# Patient Record
Sex: Male | Born: 1985 | Hispanic: Yes | Marital: Married | Smoking: Former smoker
Health system: Southern US, Community
[De-identification: ages and names within clinical notes are randomized; demographics above are authoritative.]

---

## 2014-08-04 ENCOUNTER — Ambulatory Visit (INDEPENDENT_AMBULATORY_CARE_PROVIDER_SITE_OTHER): Payer: Medicaid Other | Admitting: Family Medicine

## 2014-08-04 VITALS — BP 136/82 | HR 73 | Temp 98.2°F | Ht 72.5 in | Wt 213.0 lb

## 2014-08-04 DIAGNOSIS — N50819 Testicular pain, unspecified: Secondary | ICD-10-CM

## 2014-08-04 DIAGNOSIS — N508 Other specified disorders of male genital organs: Secondary | ICD-10-CM | POA: Diagnosis not present

## 2014-08-04 DIAGNOSIS — Z008 Encounter for other general examination: Secondary | ICD-10-CM

## 2014-08-04 DIAGNOSIS — Z0289 Encounter for other administrative examinations: Secondary | ICD-10-CM

## 2014-08-04 DIAGNOSIS — M25511 Pain in right shoulder: Secondary | ICD-10-CM | POA: Diagnosis not present

## 2014-08-04 NOTE — Progress Notes (Signed)
Jeremiah Mcconnell interpreter utilized during today's visit.  [facility]  HPI:  Patient presents to [facility] today for a new patient appointment to establish general primary care, also to discuss testicular pain and Right shoulder pain.  Patient arrived as refugee from [place].  In [place] 15 months in urban environment, not refugee camp.   Wife and 27 year old with him.  Wife's father was disappeared. Fled due to threats against his family from [employer] fighters.   RIght shoulder pain.  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  Pain with heavy lifting front of shoulder.    Testicular pain:  Present for several years.  Told he had varicocele in [place], recommended for surgery but left for [place] before this could be done.  Describes as dull ache in midline of testes.  Has had negative STD screens in past.  Has not tried anything for relief.  4-5/10 in severity.  No fevers or chills.  No discharge or dysuria.     ROS: See HPI   Preventative Care History: -Seen at health department?: yes -need records.   Past Medical Hx:  - denies  Past Surgical Hx:  -denies  Family Hx: updated in Epic - Number of family members:  Wife and daughter - Grandmother with diabetes.    PHYSICAL EXAM: Gen:  Alert, cooperative patient who appears stated age in no acute distress.  Vital signs reviewed. HEENT:  [place]/AT.  EOMI, PERRL.  MMM, tonsils non-erythematous, non-edematous.  External ears WNL, Bilateral TM's normal without retraction, redness or bulging.  Cardiac:  Regular rate and rhythm without murmur auscultated.  Good S1/S2. Pulm:  Clear to auscultation bilaterally with good air movement.  No wheezes or rales noted.   Abd:  Soft/nondistended/nontender.  Good bowel sounds throughout all four quadrants.  No masses noted.  MSK: Left shoulder WNL. Right shoulder with normal  appearance, no bruising or effusion.  tenderness along AC joint and lateral aspect of acromion.  Pain with external rotation.  Neer's positive.  No biceps tendon tenderness.  Difficulty with lateral range of motion past 90 degrees, either active or passive.     Male genitalia: not done no penile lesions or discharge no testicular masses Penis: normal, no lesions and uncircumcised Testicles: normal, no masses Scrotum: normal and I do not note a varicocele.  Does have some tenderness midline of scrotum/testes.  No lesions noted. No hernia noted.

## 2014-08-04 NOTE — Patient Instructions (Signed)
We will set you up for an ultrasound for your testicles.  We will call you with that appt.    Take the Ibuprofen for your shoulder.  The Sports Medicine office will call you about your Right shoulder.

## 2014-08-06 DIAGNOSIS — N50819 Testicular pain, unspecified: Secondary | ICD-10-CM | POA: Insufficient documentation

## 2014-08-06 DIAGNOSIS — Z0289 Encounter for other administrative examinations: Secondary | ICD-10-CM | POA: Insufficient documentation

## 2014-08-06 DIAGNOSIS — M25511 Pain in right shoulder: Secondary | ICD-10-CM | POA: Insufficient documentation

## 2014-08-06 NOTE — Assessment & Plan Note (Signed)
I do not actually feel a varicocele on my exam. Sending for ultrasound for better visualization of testicles.   Do not note a hernia.

## 2014-08-06 NOTE — Assessment & Plan Note (Signed)
Other than the above 2 problems, he is doing well.   FU in 6 months or sooner if problems arise.  Await records from HD.

## 2014-08-06 NOTE — Assessment & Plan Note (Signed)
Present for past several months.  Sounds like he continues to reinjure during work.   He desires referral to Sports Medicine. Plan to do this today for ultrasound.  Sounds like AC joint but exam not entirely consistent with this alone

## 2014-12-27 ENCOUNTER — Emergency Department (HOSPITAL_COMMUNITY)
Admission: EM | Admit: 2014-12-27 | Discharge: 2014-12-27 | Disposition: A | Discharge status: Home / Self Care | Payer: Medicaid Other | Attending: Emergency Medicine | Admitting: Emergency Medicine

## 2014-12-27 ENCOUNTER — Encounter (HOSPITAL_BASED_OUTPATIENT_CLINIC_OR_DEPARTMENT_OTHER): Payer: Self-pay | Admitting: Emergency Medicine

## 2014-12-27 ENCOUNTER — Emergency Department (HOSPITAL_BASED_OUTPATIENT_CLINIC_OR_DEPARTMENT_OTHER): Payer: Worker's Compensation

## 2014-12-27 ENCOUNTER — Emergency Department (HOSPITAL_BASED_OUTPATIENT_CLINIC_OR_DEPARTMENT_OTHER)
Admission: EM | Admit: 2014-12-27 | Discharge: 2014-12-27 | Disposition: A | Discharge status: Home / Self Care | Payer: Worker's Compensation | Attending: Emergency Medicine | Admitting: Emergency Medicine

## 2014-12-27 ENCOUNTER — Encounter (HOSPITAL_COMMUNITY): Payer: Self-pay | Admitting: Emergency Medicine

## 2014-12-27 DIAGNOSIS — Z87891 Personal history of nicotine dependence: Secondary | ICD-10-CM | POA: Diagnosis not present

## 2014-12-27 DIAGNOSIS — W2209XA Striking against other stationary object, initial encounter: Secondary | ICD-10-CM | POA: Diagnosis not present

## 2014-12-27 DIAGNOSIS — S63297A Dislocation of distal interphalangeal joint of left little finger, initial encounter: Secondary | ICD-10-CM | POA: Insufficient documentation

## 2014-12-27 DIAGNOSIS — Y9389 Activity, other specified: Secondary | ICD-10-CM | POA: Diagnosis not present

## 2014-12-27 DIAGNOSIS — W231XXA Caught, crushed, jammed, or pinched between stationary objects, initial encounter: Secondary | ICD-10-CM | POA: Insufficient documentation

## 2014-12-27 DIAGNOSIS — S61217A Laceration without foreign body of left little finger without damage to nail, initial encounter: Secondary | ICD-10-CM | POA: Insufficient documentation

## 2014-12-27 DIAGNOSIS — Y990 Civilian activity done for income or pay: Secondary | ICD-10-CM | POA: Diagnosis not present

## 2014-12-27 DIAGNOSIS — Y9289 Other specified places as the place of occurrence of the external cause: Secondary | ICD-10-CM | POA: Insufficient documentation

## 2014-12-27 DIAGNOSIS — S61218A Laceration without foreign body of other finger without damage to nail, initial encounter: Secondary | ICD-10-CM

## 2014-12-27 DIAGNOSIS — S63257A Unspecified dislocation of left little finger, initial encounter: Secondary | ICD-10-CM

## 2014-12-27 DIAGNOSIS — S6992XA Unspecified injury of left wrist, hand and finger(s), initial encounter: Secondary | ICD-10-CM | POA: Diagnosis present

## 2014-12-27 MED ORDER — LIDOCAINE-EPINEPHRINE (PF) 2 %-1:200000 IJ SOLN: 20.0000 mL | Freq: Once | INTRAMUSCULAR | Status: DC

## 2014-12-27 MED ORDER — LIDOCAINE HCL (PF) 1 % IJ SOLN
30.0000 mL | Freq: Once | INTRAMUSCULAR | Status: AC
  Administered 2014-12-27: 30 mL via INTRADERMAL
  Filled 2014-12-27: qty 30

## 2014-12-27 MED ORDER — LIDOCAINE HCL (PF) 1 % IJ SOLN
30.0000 mL | Freq: Once | INTRAMUSCULAR | Status: DC
  Filled 2014-12-27: qty 30

## 2014-12-27 NOTE — ED Provider Notes (Signed)
S: Jeremiah Mcconnell [name] is a 29 y.o. male presents to the ED with left little finger laceration as a transfer from [facility] to be evaluated by Dr. Amanda Pea.  Pt. Smashed his finger with an iron table while at work.  Patient with laceration and dislocation of the left little finger. O:  General: Awake  HEENT: Atraumatic  Resp: Normal effort  Cardiac: RRR Abd: Nondistended, soft  Neuro:No focal weakness  MSK: Laceration to the palmar side of the left little finger over the PIP extending distally A/P:  Pt with dislocation of the left little finger with associated laceration. Patient reevaluated by Dr. Amanda Pea in the ED for reduction and repair.    5:33 PM Repair completed by Dr. Amanda Pea.  Pt is to follow-up in the office as directed.    BP 130/77 mmHg  Pulse 58  Temp(Src) 98.4 F (36.9 C) (Oral)  Resp 18  SpO2 98%   1. Dislocation of left little finger, initial encounter   2. Laceration of left little finger w/o foreign body w/o damage to nail, initial encounter       Dierdre Forth, PA-C 12/27/14 1739  Laurence Spates, MD 12/28/14 [phone]

## 2014-12-27 NOTE — ED Notes (Signed)
Pt states caught left 5th digit between two tables, presents with large, irregular laceration to area, bleeding controlled, area currently soaking in betadine/ sterile water solution

## 2014-12-27 NOTE — ED Notes (Signed)
Good capillary refill noted, good sensation noted on assessment

## 2014-12-27 NOTE — ED Notes (Signed)
Translation line utilized

## 2014-12-27 NOTE — Discharge Instructions (Signed)
Cuidado de un desgarro en los adultos (Laceration Care, Adult) Un desgarro es un corte que atraviesa todas las capas de la piel y llega al tejido que se encuentra debajo de la piel. Algunos desgarros cicatrizan por s solos. Otros se deben cerrar con puntos (suturas), grapas, tiras adhesivas o adhesivo para la piel. El cuidado adecuado de un desgarro reduce al mnimo el riesgo de infecciones y ayuda a una mejor cicatrizacin. CMO CUIDAR DEL DESGARRO Si se utilizaron suturas o grapas:  Mantenga la herida limpia y seca.  Si le colocaron una venda (vendaje), debe cambiarla al menos una vez al da o como se lo haya indicado el mdico. Tambin debe cambiarla si se moja o se ensucia.  Mantenga la herida completamente seca durante las primeras 24horas o como se lo haya indicado el mdico. Transcurrido ese tiempo, puede ducharse o tomar baos de inmersin. No obstante, asegrese de no sumergir la herida en agua hasta que le hayan quitado las suturas o las grapas.  Limpie la herida una vez al da o como se lo haya indicado el mdico:  Lave la herida con agua y jabn.  Enjuguela con agua para quitar todo el jabn.  Seque dando palmaditas con una toalla limpia. No frote la herida.  Despus de limpiar la herida, aplique una delgada capa de ungento con antibitico como se lo haya indicado el mdico. Esto ayudar a prevenir las infecciones y a evitar que el vendaje se adhiera a la herida.  Las suturas o las grapas deben retirarse como lo haya indicado el mdico. Si se utilizaron tiras adhesivas:  Mantenga la herida limpia y seca.  Si le colocaron una venda (vendaje), debe cambiarla al menos una vez al da o como se lo haya indicado el mdico. Tambin debe cambiarla si se moja o se ensucia.  No deje que las tiras adhesivas se mojen. Puede baarse o ducharse, pero tenga cuidado de no mojar la herida.  Si se moja, squela dando palmaditas con una toalla limpia. No frote la herida.  Las tiras  adhesivas se caen solas. Puede recortar las tiras a medida que la herida cicatriza. No quite las tiras adhesivas que an estn pegadas a la herida. Ellas se caern cuando sea el momento. Si se utiliz pegamento para la piel:  Trate de mantener la herida seca; sin embargo, puede mojarla ligeramente cuando se bae o se duche. No sumerja la herida en el agua, por ejemplo, al nadar.  Despus de ducharse o baarse, seque la herida con cuidado dando palmaditas con una toalla limpia. No frote la herida.  No practique actividades que lo hagan transpirar mucho hasta que el adhesivo se haya salido solo.  No aplique lquidos, cremas ni ungentos medicinales en la herida mientras est el adhesivo. De lo contrario, puede despegar la pelcula de adhesivo antes de que la herida cicatrice.  Si le colocaron una venda (vendaje), debe cambiarla al menos una vez al da o como se lo haya indicado el mdico. Tambin debe cambiarla si se moja o se ensucia.  Si la herida est cubierta con un vendaje, tenga cuidado de no aplicar cinta adhesiva directamente sobre el adhesivo. De lo contrario, puede hacer que el adhesivo se despegue antes de que la herida haya cicatrizado.  No toque el adhesivo. Normalmente, el adhesivo permanece sobre la piel de 5 a 10das y luego se sale solo. Instrucciones generales  Tome los medicamentos de venta libre y los recetados solamente como se lo haya indicado el mdico.    Si le recetaron un ungento o un medicamento con antibitico, aplquelo o tmelo como se lo haya indicado el mdico. No deje de usarlo aunque la afeccin mejore.  Para ayudar a evitar la formacin de cicatrices, cbrase la herida con pantalla solar siempre que est al aire libre despus de que le hayan retirado los puntos o las tiras adhesivas o cuando todava tenga el adhesivo en la piel y la herida haya cicatrizado. Use una pantalla solar con factor de proteccin solar (FPS) de por lo menos30.  No se rasque ni se  toque la herida.  Concurra a todas las visitas de control como se lo haya indicado el mdico. Esto es importante.  Controle la herida todos los das para detectar signos de infeccin. Est atento a lo siguiente:  Dolor, hinchazn o enrojecimiento.  Lquido, sangre o pus.  Cuando est sentado o acostado, eleve la zona de la lesin por encima del nivel del corazn, si es posible. SOLICITE ATENCIN MDICA SI:  Le aplicaron la antitetnica y tiene hinchazn, dolor intenso, enrojecimiento o hemorragia en el sitio de la inyeccin.  Tiene fiebre.  La herida estaba cerrada y se abre.  Percibe que sale mal olor de la herida o del vendaje.  Nota un cuerpo extrao en la herida, como un trozo de madera o vidrio.  El dolor no se alivia con los medicamentos.  Tiene ms enrojecimiento, hinchazn o dolor en el lugar de la herida.  Observa lquido, sangre o pus que salen de la herida.  Observa que la piel cerca de la herida cambia de color.  Debe cambiar el vendaje con frecuencia debido a que hay secrecin de lquido, sangre o pus de la herida.  Aparece una nueva erupcin cutnea.  Tiene entumecimiento alrededor de la herida. SOLICITE ATENCIN MDICA DE INMEDIATO SI:  Tiene mucha hinchazn alrededor de la herida.  El dolor aumenta repentinamente y es intenso.  Tiene bultos dolorosos cerca de la herida o en la piel en cualquier parte del cuerpo.  Tiene una lnea roja que sale de la herida.  La herida est en la mano o en el pie y no puede mover correctamente uno de los dedos.  La herida est en la mano o en el pie y observa que los dedos tienen un tono plido o azulado.   Esta informacin no tiene como fin reemplazar el consejo del mdico. Asegrese de hacerle al mdico cualquier pregunta que tenga.   Document Released: 03/07/2005 Document Revised: 07/22/2014 Elsevier Interactive Patient Education 2016 Elsevier Inc.  

## 2014-12-27 NOTE — ED Notes (Signed)
Interpreter obtained by phone to assist with explaining DC instructions to patient and to go to next facility for further evaluation by Dr. Wendall Stade, MD, ED Charge RN at [facility] notified of pts arrival to their facility for further evaluation

## 2014-12-27 NOTE — ED Notes (Signed)
Spoke with [place]- Tressie Ellis ED Charge Nurse, hand off report provided

## 2014-12-27 NOTE — ED Notes (Signed)
Patient was at work  And hand was smashed between 2 tables and split left hand open

## 2014-12-27 NOTE — ED Notes (Signed)
Hand tray at bedside.  

## 2014-12-27 NOTE — Discharge Instructions (Signed)
1. Medications: Keflex, Oxycodone, usual home medications 2. Treatment: rest, drink plenty of fluids, care for wound as discussed with Dr. Amanda Pea 3. Follow Up: Please followup with Dr. Amanda Pea in 8-10 days.  Please call the office on Monday to set up this appointment.  Return to the ED for signs of infection or other concerns. Out of work for 6-8 weeks.

## 2014-12-27 NOTE — ED Notes (Signed)
DR Amanda Pea  At bed side to assess PT.

## 2014-12-27 NOTE — Op Note (Signed)
NAMERUFORD, DUDZINSKI   ACCOUNT NO.:  [id number]  MEDICAL RECORD NO.:  [id number]  LOCATION:  TR09C                        FACILITY:  [facility]  PHYSICIAN:  Dionne Ano. Kory Panjwani, M.D.DATE OF BIRTH:  1985-07-09  DATE OF PROCEDURE: DATE OF DISCHARGE:  12/27/2014                              OPERATIVE REPORT   PREOPERATIVE DIAGNOSIS:  Left small finger open distal interphalangeal joint dislocation with associated large laceration.  POSTOPERATIVE DIAGNOSIS:  Left small finger open distal interphalangeal joint dislocation with associated large laceration.  PROCEDURE: 1. Irrigation and debridement, open distal interphalangeal joint     injury with flexor tendon sheath injury left small finger after     crushing injury.  This was an excisional debridement of skin,     subcutaneous tissue, periosteal tissue, and peritendinous tissue. 2. Open relocation of distal interphalangeal joint dislocation. 3. Flexor tendon tenolysis and exploration. 4. Ulnar digital nerve repair, left small finger. 5. AP, lateral, and oblique x-rays performed, examined, and     interpreted by myself.  SURGEON:  Dionne Ano. Amanda Pea, M.D.  ASSISTANT:  None.  COMPLICATIONS:  None.  ANESTHESIA:  Block anesthetic.  DESCRIPTION OF PROCEDURE:  The patient was seen by myself, consented, had a Spanish interpreter consent the patient.  We discussed the pre and postop aftermath and time-out was observed.  He was given intermetacarpal/field block lidocaine without epinephrine.  Following this, he was prepped and draped in usual sterile fashion with Betadine scrub and paint x4 separate scrubs given that his hand was dirty. Following this, I performed a sterile field isolation and I and D of skin, subcutaneous tissue, and periosteal tissue, peritendinous tissue was accomplished.  This was an excisional debridement with scissor tip, knife, blade, and curette.  A 2 L were placed through and through the  wound.  Following this, I then performed an open relocation of the distal interphalangeal joint.  He looked quite well.  I then confirmed this with radiographs AP and lateral.  Following this, I then performed an ulnar digital nerve repair with loupe magnification utilizing fine nylon suture.  The patient tolerated this well.  This was epineural repair.  He had a good trifurcation to sew into.  There were no complicating features.  Following this, I performed a repair of the 2.5 cm laceration about the volar ulnar aspect of the finger.  I then demonstrated full active range of motion.  The patient looked well.  He was dressed with Adaptic, Xeroform, and had excellent refill.  Tourniquet time was less than 30 minutes.  There were no complicating features.  He was dressed in a dorsal blocking splint.  All sponge, needle, and instrument counts were reported as correct.  I did confirm reduction in the splint.     Dionne Ano. Amanda Pea, M.D.     [facility]  D:  12/27/2014  T:  12/27/2014  Job:  [id number]

## 2014-12-27 NOTE — Consult Note (Signed)
Reason for Consult:open DIP injury left small finger with dislocation Referring Physician: Joaquin Courts Corrales Jeremiah Mcconnell is an 29 y.o. male.  HPI: 46 show male status post injury apparent air bread. Less small finger has a crushing injury with DIP dislocation and open volar laceration. He denies other injury. He is pleasant male. I discussed him all issues through a Spanish interpreter on the telephone.  History reviewed. No pertinent past medical history.  History reviewed. No pertinent past surgical history.  No family history on file.  Social History:  reports that he has quit smoking. He does not have any smokeless tobacco history on file. He reports that he does not drink alcohol. His drug history is not on file.  Allergies: No Known Allergies  Medications: I have reviewed the patient's current medications.  No results found for this or any previous visit (from the past 48 hour(s)).  Dg Hand Complete Left  12/27/2014   CLINICAL DATA:  Left fifth digit laceration.  EXAM: LEFT HAND - COMPLETE 3+ VIEW  COMPARISON:  None.  FINDINGS: There is noted lateral and posterior dislocation of the fifth distal phalanx relative to middle phalanx. Laceration is seen in the soft tissues laterally. There appears to be a small bone fragment proximal to the fifth distal phalanx. No other abnormality is noted in the left hand.  IMPRESSION: Dislocation of the fifth distal phalanx is noted relative to middle phalanx, with soft tissue laceration seen laterally in this area. Probable small bone fragment is seen proximal to the distal phalanx concerning for small fracture.   Electronically Signed   By: Lupita Raider, M.D.   On: 12/27/2014 14:01    ROS Blood pressure 152/92, pulse 63, temperature 98.4 F (36.9 C), temperature source Oral, resp. rate 16, SpO2 100 %. Physical Exam Open distal interphalangeal joint dislocation left small finger with disarray the soft tissue. He has intact refill The  patient is alert and oriented in no acute distress. The patient complains of pain in the affected upper extremity.  The patient is noted to have a normal HEENT exam. Lung fields show equal chest expansion and no shortness of breath. Abdomen exam is nontender without distention. Lower extremity examination does not show any fracture dislocation or blood clot symptoms. Pelvis is stable and the neck and back are stable and nontender. Assessment/Plan: Open DIP dislocation with large laceration left small finger. Will plan ID and up instructions as necessary. We are planning surgery for your upper extremity. The risk and benefits of surgery to include risk of bleeding, infection, anesthesia,  damage to normal structures and failure of the surgery to accomplish its intended goals of relieving symptoms and restoring function have been discussed in detail. With this in mind we plan to proceed. I have specifically discussed with the patient the pre-and postoperative regime and the dos and don'ts and risk and benefits in great detail. Risk and benefits of surgery also include risk of dystrophy(CRPS), chronic nerve pain, failure of the healing process to go onto completion and other inherent risks of surgery The relavent the pathophysiology of the disease/injury process, as well as the alternatives for treatment and postoperative course of action has been discussed in great detail with the patient who desires to proceed.  We will do everything in our power to help you (the patient) restore function to the upper extremity. It is a pleasure to see this patient today.  Karen Chafe 12/27/2014, 5:57 PM

## 2014-12-27 NOTE — ED Notes (Signed)
MD at bedside. 

## 2014-12-27 NOTE — ED Notes (Signed)
Spanish interpreter [phone] contacted through interpreter line.

## 2014-12-27 NOTE — ED Notes (Signed)
Pt reports smashed left pinkie finger at work with the iron table. Pt can move finger. Pt states they gave me "some pills". NAD noted. Pt ambulatory and A/O x4.

## 2014-12-27 NOTE — ED Notes (Signed)
Pt does not have any form of picture ID for UDS to be performed, per pt all paper work, etc is at his job site in a locker

## 2014-12-27 NOTE — ED Provider Notes (Signed)
CSN: [id number]     Arrival date & time 12/27/14  1224 History   First MD Initiated Contact with Patient 12/27/14 1234     Chief Complaint  Patient presents with  . Hand Injury     (Consider location/radiation/quality/duration/timing/severity/associated sxs/prior Treatment) Patient is a 29 y.o. male presenting with hand injury.  Hand Injury Upper extremity pain location: L 5th finger from PIP to DIP and extending to pulp. Time since incident:  30 minutes Injury: yes   Mechanism of injury comment:  Pinched finger between large tables Pain details:    Quality:  Aching   Radiates to:  Does not radiate   Severity:  Moderate   Onset quality:  Sudden   Timing:  Constant   Progression:  Unchanged Dislocation: no   Foreign body present:  No foreign bodies Tetanus status:  Up to date Prior injury to area:  No Relieved by:  Nothing Worsened by:  Exercise, movement and stretching area Ineffective treatments:  None tried Associated symptoms: no neck pain, no numbness and no stiffness     History reviewed. No pertinent past medical history. History reviewed. No pertinent past surgical history. History reviewed. No pertinent family history. Social History  Substance Use Topics  . Smoking status: Former Games developer  . Smokeless tobacco: None  . Alcohol Use: None    Review of Systems  Musculoskeletal: Negative for stiffness and neck pain.  All other systems reviewed and are negative.     Allergies  Review of patient's allergies indicates no known allergies.  Home Medications   Prior to Admission medications   Not on File   BP 130/83 mmHg  Pulse 71  Temp(Src) 98.9 F (37.2 C) (Oral)  Resp 16  SpO2 99% Physical Exam  Constitutional: He is oriented to person, place, and time. He appears well-developed and well-nourished.  HENT:  Head: Normocephalic and atraumatic.  Eyes: Conjunctivae and EOM are normal.  Neck: Normal range of motion. Neck supple.  Cardiovascular: Normal  rate, regular rhythm and normal heart sounds.   Pulmonary/Chest: Effort normal and breath sounds normal. No respiratory distress.  Abdominal: He exhibits no distension. There is no tenderness. There is no rebound and no guarding.  Musculoskeletal: Normal range of motion.  Laceration to L palmar 5th digit from distal to PIP extending over DIP  Neurological: He is alert and oriented to person, place, and time.  Skin: Skin is warm and dry.  Vitals reviewed.   ED Course  Procedures (including critical care time) Labs Review Labs Reviewed - No data to display  Imaging Review Dg Hand Complete Left  12/27/2014   CLINICAL DATA:  Left fifth digit laceration.  EXAM: LEFT HAND - COMPLETE 3+ VIEW  COMPARISON:  None.  FINDINGS: There is noted lateral and posterior dislocation of the fifth distal phalanx relative to middle phalanx. Laceration is seen in the soft tissues laterally. There appears to be a small bone fragment proximal to the fifth distal phalanx. No other abnormality is noted in the left hand.  IMPRESSION: Dislocation of the fifth distal phalanx is noted relative to middle phalanx, with soft tissue laceration seen laterally in this area. Probable small bone fragment is seen proximal to the distal phalanx concerning for small fracture.   Electronically Signed   By: Lupita Raider, M.D.   On: 12/27/2014 14:01   I have personally reviewed and evaluated these images and lab results as part of my medical decision-making.   EKG Interpretation None  MDM   Final diagnoses:  Laceration of little finger, initial encounter    29 y.o. male without pertinent PMH presents with laceration from crush/pinch injury.  Sensation intact, tetanus UTD.  Wu as above.  Spoke with hand Amanda Pea) who will [facility] at [facility]  DC for transfer by POV  I have reviewed all laboratory and imaging studies if ordered as above  1. Laceration of little finger, initial encounter         Mirian Mo,  MD 12/27/14 1506

## 2014-12-27 NOTE — ED Notes (Signed)
Declined W/C at D/C and was escorted to lobby by RN. 

## 2014-12-27 NOTE — Progress Notes (Signed)
Patient ID: Jeremiah Mcconnell, male   DOB: 04/02/1985, 29 y.o.   MRN: [id number] See surgical note [id number] [employer] MD

## 2015-01-19 ENCOUNTER — Ambulatory Visit (INDEPENDENT_AMBULATORY_CARE_PROVIDER_SITE_OTHER): Payer: Medicaid Other | Admitting: *Deleted

## 2015-01-19 DIAGNOSIS — Z23 Encounter for immunization: Secondary | ICD-10-CM

## 2015-02-20 ENCOUNTER — Telehealth: Payer: Self-pay | Admitting: *Deleted

## 2015-02-20 NOTE — Telephone Encounter (Signed)
Interpreter called on patients behalf to check on the status of patients referral to sports medicine as well as shoulder X-ray. Patient was seen on 08/04/14 and spoke with resident about x-ray of shoulder and sports med referral.  Patient is upset because he has yet to receive a follow-up call or appointment. Please contact interpreter to follow up with patient

## 2015-02-23 NOTE — Addendum Note (Signed)
Addended byGwendolyn Grant: Dajane Valli, Newt LukesJEFFREY H on: 02/23/2015 03:45 PM   Modules accepted: Orders

## 2015-02-23 NOTE — Telephone Encounter (Signed)
I don't really remember.  I'll put in a sports medicine referral

## 2015-02-23 NOTE — Telephone Encounter (Signed)
Do you remember this guy? Your note says refer to sports med but I don't see that it was done.Jeremiah Mcconnell..Jeremiah Mcconnell

## 2015-03-13 ENCOUNTER — Ambulatory Visit: Payer: Medicaid Other | Admitting: Family Medicine

## 2016-03-28 ENCOUNTER — Ambulatory Visit: Payer: Self-pay | Admitting: Internal Medicine

## 2017-03-23 IMAGING — CR DG HAND COMPLETE 3+V*L*
3 series · 3 of 3 positions shown · non-contrast
Comparison: None.

CLINICAL DATA: Left fifth digit laceration.

EXAM:
LEFT HAND - COMPLETE 3+ VIEW

[x hand pa left]
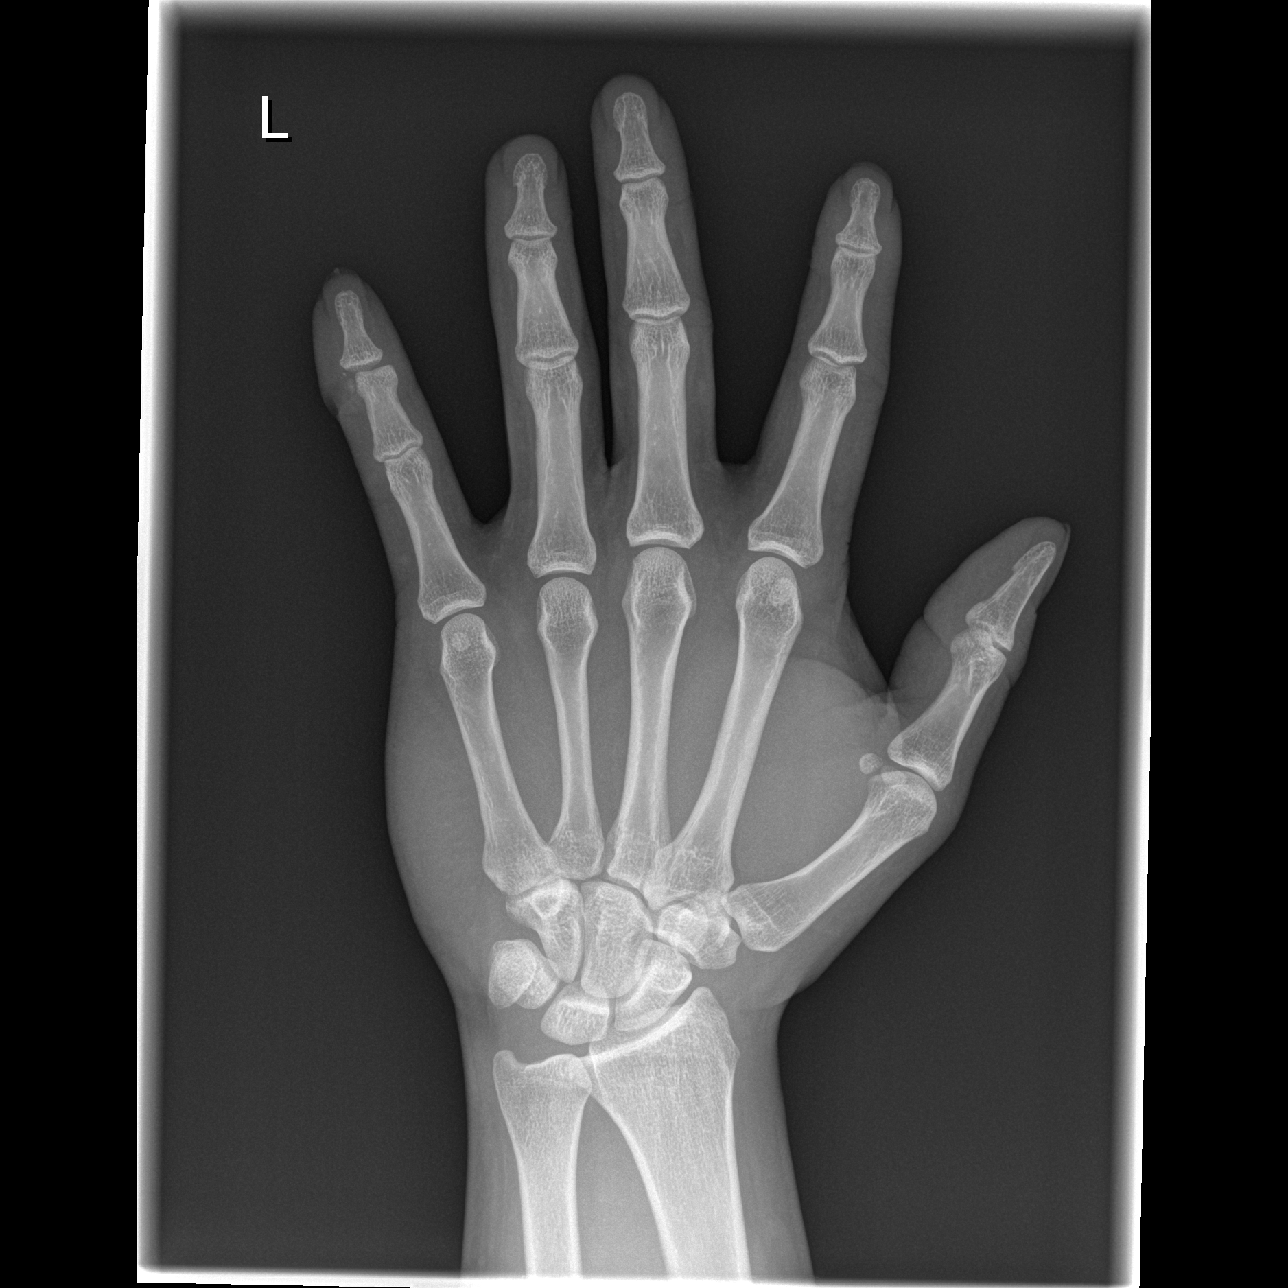

[x hand oblique left]
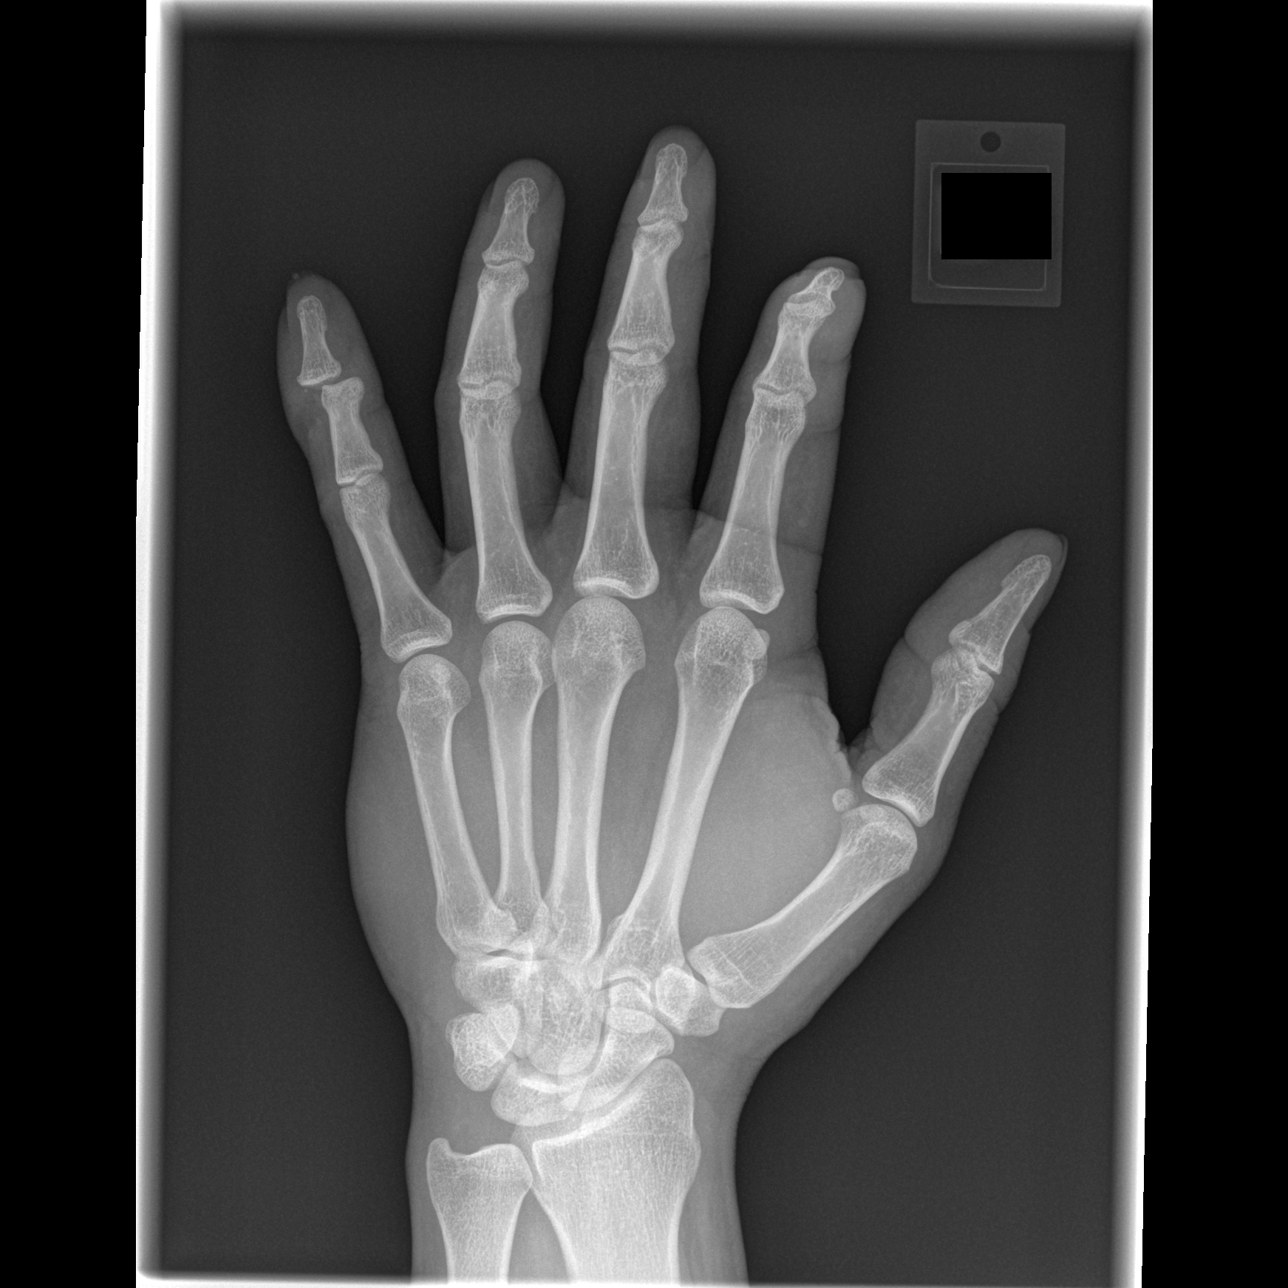

[x hand lat left *]
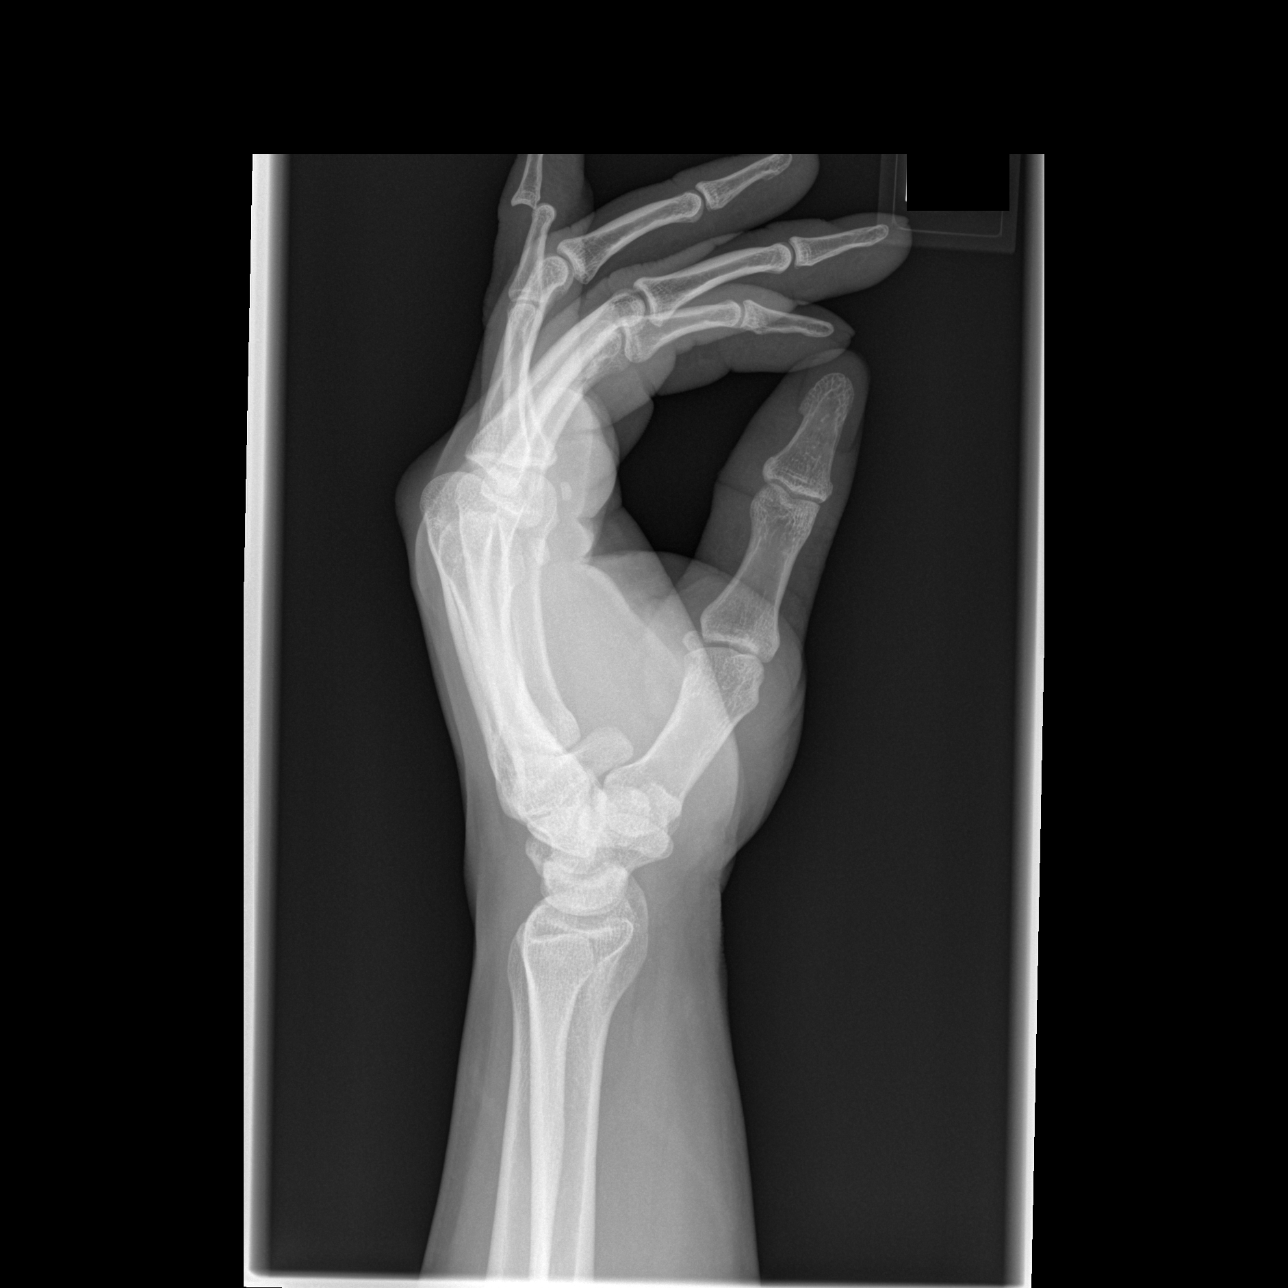

[3 of 3 positions shown; findings below may reference images not displayed]

FINDINGS: There is noted lateral and posterior dislocation of the fifth distal
phalanx relative to middle phalanx. Laceration is seen in the soft
tissues laterally. There appears to be a small bone fragment
proximal to the fifth distal phalanx. No other abnormality is noted
in the left hand.
IMPRESSION: Dislocation of the fifth distal phalanx is noted relative to middle
phalanx, with soft tissue laceration seen laterally in this area.
Probable small bone fragment is seen proximal to the distal phalanx
concerning for small fracture.

## 2019-05-14 ENCOUNTER — Ambulatory Visit: Payer: BC Managed Care – PPO | Admitting: Medical

## 2019-05-14 ENCOUNTER — Other Ambulatory Visit: Payer: Self-pay

## 2019-05-14 VITALS — BP 124/70 | HR 79 | Temp 98.0°F | Resp 18 | Ht 70.0 in | Wt 233.8 lb

## 2019-05-14 DIAGNOSIS — Z23 Encounter for immunization: Secondary | ICD-10-CM

## 2019-05-14 DIAGNOSIS — R1013 Epigastric pain: Secondary | ICD-10-CM | POA: Diagnosis not present

## 2019-05-14 DIAGNOSIS — Z113 Encounter for screening for infections with a predominantly sexual mode of transmission: Secondary | ICD-10-CM | POA: Diagnosis not present

## 2019-05-14 DIAGNOSIS — Z0000 Encounter for general adult medical examination without abnormal findings: Secondary | ICD-10-CM | POA: Diagnosis not present

## 2019-05-14 DIAGNOSIS — R5383 Other fatigue: Secondary | ICD-10-CM | POA: Diagnosis not present

## 2019-05-14 MED ORDER — FAMOTIDINE 20 MG PO TABS: 20.0000 mg | ORAL_TABLET | Freq: Every day | ORAL | 0 refills | Status: DC

## 2019-05-14 NOTE — Patient Instructions (Addendum)
For you wellness exam today I have ordered future cbc, cmp, tsh, lipid panel and hiv.(get done when fasting)  For epigastric pain, I order h pylori breath test. After test done can start famotadine daily.  Tdap today.  For fatigue add tsh, b12 and b1.  Recommend exercise and healthy diet.  We will let you know lab results as they come in.  Follow up date appointment will be determined after lab review.     Cuidados preventivos en los hombres de 21 a 39 aos de edad Preventive Care 51-65 Years Old, Male Los cuidados preventivos hacen referencia a las opciones en cuanto al estilo de vida y a las visitas al mdico, las cuales pueden promover la salud y Counsellor. Esto puede comprender lo siguiente: Un examen fsico anual. Esto tambin se conoce como control de bienestar anual. Exmenes dentales y oculares de [place] regular. Vacunas. Estudios para Hospital doctor. Opciones saludables de estilo de vida, como seguir una dieta saludable, hacer ejercicio regularmente, no usar drogas ni productos que contengan nicotina y tabaco, y limitar el consumo de alcohol. Qu puedo esperar para mi visita de cuidado preventivo? Examen fsico El mdico controlar lo siguiente: Diplomatic Services operational officer y [place]. Estos datos se pueden usar para calcular el ndice de masa corporal (IMC), una medicin que indica si usted tiene un peso saludable. Frecuencia cardaca y presin arterial. Piel para detectar manchas anormales. Asesoramiento El mdico puede hacerle preguntas sobre lo siguiente: Consumo de tabaco, alcohol y drogas. Bienestar emocional. Bienestar en el hogar y sus relaciones personales. Actividad sexual. Hbitos de alimentacin. Trabajo y Tab laboral. Ladell Heads vacunas necesito?  [place] antigripal Se recomienda aplicarse esta vacuna todos los [place]. Vacuna contra el ttanos, la difteria y la tos ferina (Tdap) Es posible que tenga que aplicarse un refuerzo contra el ttanos y la difteria (DT)  cada 10aos. Vacuna contra la varicela Es posible que tenga que aplicrsela si an no la recibi. Vacuna contra el virus del papiloma humano (VPH) Si el mdico se lo recomienda, Engineer, materials tres dosis a lo largo de 6 meses. Vacuna contra el sarampin, la rubola y las paperas (SRP) Tal vez tenga que aplicarse por lo menos una dosis de la [place]. Tambin es posible que necesite una segunda dosis. Vacuna antimeningoccica conjugada (MenACWY) Se recomienda la aplicacin de una dosis si tiene [employer] 19 y los 21aos de [place], y es estudiante universitario de Engineer, maintenance ao que vive en una residencia estudiantil, o si tiene una de varias afecciones mdicas. Podra tambin necesitar dosis de refuerzo. Vacuna antineumoccica conjugada (PCV13) Puede necesitar esta vacuna si tiene determinadas enfermedades y no se vacun anteriormente. Madilyn Fireman antineumoccica de polisacridos (PPSV23) Quizs tenga que aplicarse una o dos dosis si fuma o si tiene determinadas afecciones. Vacuna contra la hepatitis A Es posible que necesite esta vacuna si tiene ciertas afecciones o si viaja o trabaja en lugares en los que podra estar expuesto a la hepatitis A. Vacuna contra la hepatitis B Es posible que necesite esta vacuna si tiene ciertas afecciones o si viaja o trabaja en lugares en los que podra estar expuesto a la hepatitis B. Vacuna antihaemophilus influenzae tipo B (Hib) Es posible que necesite esta vacuna si tiene algunos factores de [place]. Puede recibir las vacunas en forma de dosis individuales o en forma de dos o ms vacunas juntas en la misma inyeccin (vacunas combinadas). Hable con su mdico [employer] y beneficios de las vacunas [place]. Qu pruebas necesito? Anlisis de [employer] de lpidos  y colesterol. Estos se pueden verificar cada 5 aos, a partir de los 90 aos de [place]. Anlisis de hepatitisC. Anlisis de hepatitisB. Pruebas de deteccin  Pruebas de deteccin de la diabetes.  Esto se Set designer un control del azcar en la sangre (glucosa) despus de no haber comido durante un periodo de tiempo (ayuno). Anlisis de enfermedades de transmisin sexual (ETS). Hable con su mdico [employer], las opciones de tratamiento y, si corresponde, la necesidad de Optometrist ms pruebas. Siga estas instrucciones en su casa: Comida y bebida  Siga una dieta que incluya frutas y verduras frescas, cereales integrales, protenas magras y productos lcteos descremados. Tome los suplementos vitamnicos y [employer] se lo haya indicado el mdico. No beba alcohol si el mdico se lo prohbe. Si bebe alcohol: Limite la cantidad que consume de 0 a 2 medidas por da. Est atento a la cantidad de alcohol que hay en las bebidas que toma. En los [place], una medida equivale a una botella de cerveza de 12oz (330ml), un vaso de vino de 5oz (162ml) o un vaso de una bebida alcohlica de alta graduacin de 1oz (18ml). Estilo de [employer] y las encas a diario. Mantngase activo. Haga al menos 53minutos de ejercicio 5o ms [employer]. No consuma ningn producto que contenga nicotina o tabaco, como cigarrillos, cigarrillos electrnicos y tabaco de Higher education careers adviser. Si necesita ayuda para dejar de fumar, consulte al mdico. Si es sexualmente activo, practique sexo seguro. Use un condn u otra forma de proteccin para prevenir las ITS (infecciones de transmisin sexual). Cundo volver? Acuda al mdico una vez al ao para una visita de control. Pregntele al mdico con qu frecuencia debe realizarse un control de la vista y los dientes. Mantenga su esquema de vacunacin al da. Esta informacin no tiene Marine scientist el consejo del mdico. Asegrese de hacerle al mdico cualquier pregunta que tenga. Document Revised: 03/30/2018 Document Reviewed: 03/30/2018 Elsevier Patient Education  [place].

## 2019-05-14 NOTE — Progress Notes (Signed)
Subjective:    Patient ID: Jeremiah Mcconnell, male    DOB: 1985/09/12, 34 y.o.   MRN: [id number]  HPI  Pt in for first time. Will get wellness exam/cpe today. Labs in future.  Pt works at [employer]. Pt does enjoy exercise but works a lot 5-7 days a week. Non smoker. Pt drinks about 2 times a month on weekends. Pt states he tried to eat healthy.   Pt will get tdap today.   Review of Systems  Constitutional: Positive for fatigue. Negative for chills and fever.       Mild fatigue. Less than before. He was diagnosed with covid and energy not back to normal yet.  HENT: Negative for congestion, ear discharge, ear pain, facial swelling and mouth sores.   Respiratory: Negative for choking, chest tightness, shortness of breath and wheezing.   Cardiovascular: Negative for chest pain and palpitations.  Gastrointestinal: Negative for abdominal pain.  Genitourinary: Negative for difficulty urinating, flank pain, hematuria and urgency.  Musculoskeletal: Positive for back pain.       Pt has some bilateral lower back pain since December. He states since had covid 03-25-2019. Also some fatigue since covid.  Neurological: Negative for dizziness, speech difficulty, weakness and numbness.  Hematological: Negative for adenopathy. Does not bruise/bleed easily.  Psychiatric/Behavioral: Negative for behavioral problems.    No past medical history on file.   Social History   Socioeconomic History  . Marital status: Married    Spouse name: Not on file  . Number of children: Not on file  . Years of education: Not on file  . Highest education level: Not on file  Occupational History  . Not on file  Tobacco Use  . Smoking status: Former Smoker  Substance and Sexual Activity  . Alcohol use: No    Alcohol/week: 0.0 standard drinks  . Drug use: Not on file  . Sexual activity: Not on file  Other Topics Concern  . Not on file  Social History Narrative  . Not on file   Social  Determinants of Health   Financial Resource Strain:   . Difficulty of Paying Living Expenses: Not on file  Food Insecurity:   . Worried About Charity fundraiser in the Last Year: Not on file  . Ran Out of Food in the Last Year: Not on file  Transportation Needs:   . Lack of Transportation (Medical): Not on file  . Lack of Transportation (Non-Medical): Not on file  Physical Activity:   . Days of Exercise per Week: Not on file  . Minutes of Exercise per Session: Not on file  Stress:   . Feeling of Stress : Not on file  Social Connections:   . Frequency of Communication with Friends and Family: Not on file  . Frequency of Social Gatherings with Friends and Family: Not on file  . Attends Religious Services: Not on file  . Active Member of Clubs or Organizations: Not on file  . Attends Archivist Meetings: Not on file  . Marital Status: Not on file  Intimate Partner Violence:   . Fear of Current or Ex-Partner: Not on file  . Emotionally Abused: Not on file  . Physically Abused: Not on file  . Sexually Abused: Not on file    No past surgical history on file.  No family history on file.  No Known Allergies  No current outpatient medications on file prior to visit.   No current facility-administered medications on file  prior to visit.    BP 124/70 (BP Location: Right Arm, Patient Position: Sitting, Cuff Size: Large)   Pulse 79   Temp 98 F (36.7 C) (Temporal)   Resp 18   Ht 5\' 10"  (1.778 m)   Wt 233 lb 12.8 oz (106.1 kg)   SpO2 96%   BMI 33.55 kg/m       Objective:   Physical Exam   General Mental Status- Alert. General Appearance- Not in acute distress.   Skin General: Color- Normal Color. Moisture- Normal Moisture.  Neck Carotid Arteries- Normal color. Moisture- Normal Moisture. No carotid bruits. No JVD.  Chest and Lung Exam Auscultation: Breath Sounds:-Normal.  Cardiovascular Auscultation:Rythm- Regular. Murmurs & Other Heart  Sounds:Auscultation of the heart reveals- No Murmurs.  Abdomen Inspection:-Inspeection Normal. Palpation/Percussion:Note:No mass. Palpation and Percussion of the abdomen reveal- Non Tender, Non Distended + BS, no rebound or guarding.   Neurologic Cranial Nerve exam:- CN III-XII intact(No nystagmus), symmetric smile. Strength:- 5/5 equal and symmetric strength both upper and lower extremities.     Assessment & Plan:  For you wellness exam today I have ordered future cbc, cmp, tsh, lipid panel and hiv.(get done when fasting)  Tdap today.  Recommend exercise and healthy diet.  We will let you know lab results as they come in.  Follow up date appointment will be determined after lab review.   , PA-C

## 2019-05-22 ENCOUNTER — Other Ambulatory Visit (INDEPENDENT_AMBULATORY_CARE_PROVIDER_SITE_OTHER): Payer: BC Managed Care – PPO

## 2019-05-22 ENCOUNTER — Other Ambulatory Visit: Payer: Self-pay

## 2019-05-22 DIAGNOSIS — R5383 Other fatigue: Secondary | ICD-10-CM | POA: Diagnosis not present

## 2019-05-22 DIAGNOSIS — Z0000 Encounter for general adult medical examination without abnormal findings: Secondary | ICD-10-CM | POA: Diagnosis not present

## 2019-05-22 DIAGNOSIS — Z113 Encounter for screening for infections with a predominantly sexual mode of transmission: Secondary | ICD-10-CM

## 2019-05-22 DIAGNOSIS — R1013 Epigastric pain: Secondary | ICD-10-CM

## 2019-05-23 LAB — LIPID PANEL
Cholesterol: 195 mg/dL (ref 0–200)
HDL: 41.1 mg/dL (ref >39.00)
LDL Cholesterol: 131 mg/dL — ABNORMAL HIGH (ref 0–99)
NonHDL: 154.13
Total CHOL/HDL Ratio: 5
Triglycerides: 115 mg/dL (ref 0.0–149.0)
VLDL: 23 mg/dL (ref 0.0–40.0)

## 2019-05-23 LAB — CBC WITH DIFFERENTIAL/PLATELET
Basophils Absolute: 0.1 10*3/uL (ref 0.0–0.1)
Basophils Relative: 1 % (ref 0.0–3.0)
Eosinophils Absolute: 0.1 10*3/uL (ref 0.0–0.7)
Eosinophils Relative: 2.1 % (ref 0.0–5.0)
HCT: 44 % (ref 39.0–52.0)
Hemoglobin: 14.9 g/dL (ref 13.0–17.0)
Lymphocytes Relative: 44 % (ref 12.0–46.0)
Lymphs Abs: 2.7 10*3/uL (ref 0.7–4.0)
MCHC: 33.9 g/dL (ref 30.0–36.0)
MCV: 86.5 fl (ref 78.0–100.0)
Monocytes Absolute: 0.4 10*3/uL (ref 0.1–1.0)
Monocytes Relative: 6.9 % (ref 3.0–12.0)
Neutro Abs: 2.8 10*3/uL (ref 1.4–7.7)
Neutrophils Relative %: 46 % (ref 43.0–77.0)
Platelets: 214 10*3/uL (ref 150.0–400.0)
RBC: 5.08 Mil/uL (ref 4.22–5.81)
RDW: 13.8 % (ref 11.5–15.5)
WBC: 6.1 10*3/uL (ref 4.0–10.5)

## 2019-05-23 LAB — COMPREHENSIVE METABOLIC PANEL
ALT: 72 U/L — ABNORMAL HIGH (ref 0–53)
AST: 39 U/L — ABNORMAL HIGH (ref 0–37)
Albumin: 4.5 g/dL (ref 3.5–5.2)
Alkaline Phosphatase: 50 U/L (ref 39–117)
BUN: 16 mg/dL (ref 6–23)
CO2: 30 mEq/L (ref 19–32)
Calcium: 9.8 mg/dL (ref 8.4–10.5)
Chloride: 102 mEq/L (ref 96–112)
Creatinine, Ser: 0.95 mg/dL (ref 0.40–1.50)
GFR: 92.23 mL/min (ref >60.00)
Glucose, Bld: 79 mg/dL (ref 70–99)
Potassium: 4 mEq/L (ref 3.5–5.1)
Sodium: 141 mEq/L (ref 135–145)
Total Bilirubin: 1.4 mg/dL — ABNORMAL HIGH (ref 0.2–1.2)
Total Protein: 7.4 g/dL (ref 6.0–8.3)

## 2019-05-23 LAB — HIV ANTIBODY (ROUTINE TESTING W REFLEX): HIV 1&2 Ab, 4th Generation: NONREACTIVE

## 2019-05-23 LAB — TSH: TSH: 4.46 u[IU]/mL (ref 0.35–4.50)

## 2019-05-23 LAB — VITAMIN B12: Vitamin B-12: 402 pg/mL (ref 211–911)

## 2019-05-25 LAB — VITAMIN B1: Vitamin B1 (Thiamine): 11 nmol/L (ref 8–30)

## 2019-06-03 ENCOUNTER — Telehealth: Payer: Self-pay | Admitting: Medical

## 2019-06-03 ENCOUNTER — Other Ambulatory Visit: Payer: Self-pay

## 2019-06-03 LAB — H. PYLORI BREATH TEST: H. pylori Breath Test: DETECTED — AB

## 2019-06-03 MED ORDER — AMOXICILLIN 875 MG PO TABS: 875.0000 mg | ORAL_TABLET | Freq: Two times a day (BID) | ORAL | 0 refills | Status: DC

## 2019-06-03 MED ORDER — CLARITHROMYCIN ER 500 MG PO TB24: 1000.0000 mg | ORAL_TABLET | Freq: Every day | ORAL | 0 refills | Status: DC

## 2019-06-03 MED ORDER — OMEPRAZOLE 40 MG PO CPDR: 40.0000 mg | DELAYED_RELEASE_CAPSULE | Freq: Every day | ORAL | 1 refills | Status: DC

## 2019-06-03 NOTE — Telephone Encounter (Signed)
Rx antibiotic and ppi for h pylori.

## 2019-09-26 ENCOUNTER — Ambulatory Visit: Payer: Self-pay

## 2019-10-17 ENCOUNTER — Ambulatory Visit: Payer: Self-pay | Attending: Internal Medicine

## 2019-10-17 DIAGNOSIS — Z23 Encounter for immunization: Secondary | ICD-10-CM

## 2019-10-17 NOTE — Progress Notes (Signed)
   Covid-19 Vaccination Clinic  Name:  Jeremiah Mcconnell    MRN: [id number] DOB: Feb 14, 1986  10/17/2019  Mr. Jeremiah Mcconnell was observed post Covid-19 immunization for 15 minutes without incident. He was provided with Vaccine Information Sheet and instruction to access the V-Safe system.   Mr. Jeremiah Mcconnell was instructed to call 911 with any severe reactions post vaccine: Marland Kitchen Difficulty breathing  . Swelling of face and throat  . A fast heartbeat  . A bad rash all over body  . Dizziness and weakness   Immunizations Administered    Name Date Dose VIS Date Route   Pfizer COVID-19 Vaccine 10/17/2019  2:34 PM 0.3 mL 05/15/2018 Intramuscular   Manufacturer: [employer], [employer]   Lot: [id number]   NDC: [address]-0

## 2020-04-14 ENCOUNTER — Ambulatory Visit: Payer: Medicaid Other | Admitting: Medical

## 2020-04-19 DIAGNOSIS — H5213 Myopia, bilateral: Secondary | ICD-10-CM | POA: Diagnosis not present

## 2020-05-22 ENCOUNTER — Ambulatory Visit: Payer: Medicaid Other | Admitting: Medical

## 2020-12-09 ENCOUNTER — Ambulatory Visit: Payer: Medicaid Other | Admitting: Medical

## 2020-12-18 ENCOUNTER — Encounter: Payer: Self-pay | Admitting: Internal Medicine

## 2020-12-18 ENCOUNTER — Ambulatory Visit: Payer: Medicaid Other | Admitting: Internal Medicine

## 2020-12-18 ENCOUNTER — Other Ambulatory Visit: Payer: Self-pay

## 2020-12-18 VITALS — BP 122/84 | HR 71 | Temp 98.2°F | Resp 18 | Ht 70.0 in | Wt 238.5 lb

## 2020-12-18 DIAGNOSIS — R5383 Other fatigue: Secondary | ICD-10-CM

## 2020-12-18 DIAGNOSIS — N50812 Left testicular pain: Secondary | ICD-10-CM

## 2020-12-18 DIAGNOSIS — N50811 Right testicular pain: Secondary | ICD-10-CM | POA: Diagnosis not present

## 2020-12-18 DIAGNOSIS — R519 Headache, unspecified: Secondary | ICD-10-CM | POA: Diagnosis not present

## 2020-12-18 DIAGNOSIS — R42 Dizziness and giddiness: Secondary | ICD-10-CM | POA: Diagnosis not present

## 2020-12-18 LAB — CBC WITH DIFFERENTIAL/PLATELET
Basophils Absolute: 0 10*3/uL (ref 0.0–0.1)
Basophils Relative: 0.8 % (ref 0.0–3.0)
Eosinophils Absolute: 0.1 10*3/uL (ref 0.0–0.7)
Eosinophils Relative: 1.2 % (ref 0.0–5.0)
HCT: 44.9 % (ref 39.0–52.0)
Hemoglobin: 15.1 g/dL (ref 13.0–17.0)
Lymphocytes Relative: 41.9 % (ref 12.0–46.0)
Lymphs Abs: 2.3 10*3/uL (ref 0.7–4.0)
MCHC: 33.6 g/dL (ref 30.0–36.0)
MCV: 85.8 fl (ref 78.0–100.0)
Monocytes Absolute: 0.4 10*3/uL (ref 0.1–1.0)
Monocytes Relative: 6.5 % (ref 3.0–12.0)
Neutro Abs: 2.7 10*3/uL (ref 1.4–7.7)
Neutrophils Relative %: 49.6 % (ref 43.0–77.0)
Platelets: 184 10*3/uL (ref 150.0–400.0)
RBC: 5.24 Mil/uL (ref 4.22–5.81)
RDW: 14 % (ref 11.5–15.5)
WBC: 5.4 10*3/uL (ref 4.0–10.5)

## 2020-12-18 LAB — COMPREHENSIVE METABOLIC PANEL
ALT: 40 U/L (ref 0–53)
AST: 31 U/L (ref 0–37)
Albumin: 4.7 g/dL (ref 3.5–5.2)
Alkaline Phosphatase: 55 U/L (ref 39–117)
BUN: 15 mg/dL (ref 6–23)
CO2: 28 mEq/L (ref 19–32)
Calcium: 9.2 mg/dL (ref 8.4–10.5)
Chloride: 101 mEq/L (ref 96–112)
Creatinine, Ser: 0.99 mg/dL (ref 0.40–1.50)
GFR: 100.47 mL/min (ref >60.00)
Glucose, Bld: 90 mg/dL (ref 70–99)
Potassium: 3.8 mEq/L (ref 3.5–5.1)
Sodium: 139 mEq/L (ref 135–145)
Total Bilirubin: 1.5 mg/dL — ABNORMAL HIGH (ref 0.2–1.2)
Total Protein: 7.7 g/dL (ref 6.0–8.3)

## 2020-12-18 LAB — SEDIMENTATION RATE: Sed Rate: 3 mm/hr (ref 0–15)

## 2020-12-18 LAB — B12 AND FOLATE PANEL
Folate: 11.2 ng/mL (ref >5.9)
Vitamin B-12: 232 pg/mL (ref 211–911)

## 2020-12-18 LAB — TSH: TSH: 5.15 u[IU]/mL (ref 0.35–5.50)

## 2020-12-18 LAB — VITAMIN D 25 HYDROXY (VIT D DEFICIENCY, FRACTURES): VITD: 22.67 ng/mL — ABNORMAL LOW (ref 30.00–100.00)

## 2020-12-18 NOTE — Patient Instructions (Signed)
We will call and schedule a brain MRI  GO TO THE LAB : Get the blood work     GO TO THE FRONT DESK, PLEASE SCHEDULE YOUR APPOINTMENTS Come back for a checkup with your primary provider in 2 weeks  If you have fever, chills, severe headache or chest pain: Go to the ER.

## 2020-12-18 NOTE — Progress Notes (Signed)
Subjective:    Patient ID: Jeremiah Mcconnell, male    DOB: 1985/12/15, 35 y.o.   MRN: [id number]  DOS:  12/18/2020 Type of visit - description: Acute visit.  Symptoms a started 4 weeks ago: Fatigue, somnolence. Has sharp chest pain on and off, it last about 30 seconds, middle of the chest, no associated symptoms, changes with changing position. Has a mild frontal headache, persisting, daily.  Denies any recent head injury. Has occasional dizziness, described as spinning, last 15 seconds, no syncope.  No associated nausea.   Has testicular pain bilaterally, this is actually going for more than a year.  Denies any swelling per se, no dysuria or gross hematuria.  Testicular pain sometimes is worse when he walks.  When asked, he admits to  stress, he is not sure of the source  Wt Readings from Last 3 Encounters:  12/18/20 238 lb 8 oz (108.2 kg)  05/14/19 233 lb 12.8 oz (106.1 kg)  08/04/14 213 lb (96.6 kg)     Review of Systems Denies fever chills No shortness of breath or edema No nausea, vomiting, diarrhea.  No blood in the stools. No cough No diplopia No dysuria, gross hematuria or difficulty urinating. Admits to falling asleep very easily.  History reviewed. No pertinent past medical history.  History reviewed. No pertinent surgical history.  Allergies as of 12/18/2020   No Known Allergies      Medication List        Accurate as of December 18, 2020  2:31 PM. If you have any questions, ask your nurse or doctor.          STOP taking these medications    amoxicillin 875 MG tablet Commonly known as: AMOXIL Stopped by: Willow Ora, MD   clarithromycin 500 MG 24 hr tablet Commonly known as: BIAXIN XL Stopped by: Willow Ora, MD       TAKE these medications    famotidine 20 MG tablet Commonly known as: PEPCID Take 1 tablet (20 mg total) by mouth daily.   omeprazole 40 MG capsule Commonly known as: PRILOSEC Take 1 capsule (40 mg total) by mouth  daily.           Objective:   Physical Exam BP 122/84 (BP Location: Left Arm, Patient Position: Sitting, Cuff Size: Normal)   Pulse 71   Temp 98.2 F (36.8 C) (Oral)   Resp 18   Ht 5\' 10"  (1.778 m)   Wt 238 lb 8 oz (108.2 kg)   SpO2 96%   BMI 34.22 kg/m  General:   Well developed, NAD, BMI noted.  HEENT:  Normocephalic . Face symmetric, atraumatic Neck: Full range of motion, minimal TTP at the cervical spine  distally.   lungs:  CTA B Normal respiratory effort, no intercostal retractions, no accessory muscle use. Heart: RRR,  no murmur.  Abdomen:  Not distended, soft, non-tender. No rebound or rigidity.   GU: Scrotal contents normal, testicles symmetric, no TTP. Skin: Not pale. Not jaundice Lower extremities: no pretibial edema bilaterally  Neurologic:  alert & oriented X3.  Speech normal, gait appropriate for age and unassisted EOMI, motor and DTR symmetric Psych--  Cognition and judgment appear intact.  Cooperative with normal attention span and concentration.  Behavior appropriate. No anxious or depressed appearing.     Assessment     35 year old male, healthy, presents with w/ multiple sxs:  Fatigue, chest pain, headache, dizziness, testicular pain: Multiple symptoms for the last 4 weeks, exam is benign, I  cannot put all the symptoms together under a single diagnosis although I suspect a large part to see it could be a stress, anxiety and perhaps sleep apnea. Plan: General labs including CMP, CBC, TSH, sed rate, B12, folic acid, vitamin D Brain MRI Follow-up with PCP within the next 2 weeks.  Consider SSRIs or sleep study Testicular pain: Exam is normal, this is going on for a year or so, reassess at the next opportunity by PCP All instructions written in [place] and discussed in [place].  This visit occurred during the SARS-CoV-2 public health emergency.  Safety protocols were in place, including screening questions prior to the visit, additional usage of  staff PPE, and extensive cleaning of exam room while observing appropriate contact time as indicated for disinfecting solutions.

## 2020-12-21 MED ORDER — VITAMIN D (ERGOCALCIFEROL) 1.25 MG (50000 UNIT) PO CAPS: 50000.0000 [IU] | ORAL_CAPSULE | ORAL | 0 refills | Status: DC

## 2020-12-21 NOTE — Addendum Note (Signed)
Addended byConrad [place] D on: 12/21/2020 08:25 AM   Modules accepted: Orders

## 2021-01-05 ENCOUNTER — Encounter: Payer: Self-pay | Admitting: Medical

## 2021-01-05 ENCOUNTER — Other Ambulatory Visit: Payer: Self-pay

## 2021-01-05 ENCOUNTER — Ambulatory Visit: Payer: Medicaid Other | Admitting: Medical

## 2021-01-05 VITALS — BP 137/70 | HR 70 | Temp 98.7°F | Resp 12 | Ht 70.0 in | Wt 242.4 lb

## 2021-01-05 DIAGNOSIS — M79669 Pain in unspecified lower leg: Secondary | ICD-10-CM

## 2021-01-05 DIAGNOSIS — N50811 Right testicular pain: Secondary | ICD-10-CM | POA: Diagnosis not present

## 2021-01-05 DIAGNOSIS — R519 Headache, unspecified: Secondary | ICD-10-CM

## 2021-01-05 DIAGNOSIS — W57XXXS Bitten or stung by nonvenomous insect and other nonvenomous arthropods, sequela: Secondary | ICD-10-CM

## 2021-01-05 DIAGNOSIS — S40861S Insect bite (nonvenomous) of right upper arm, sequela: Secondary | ICD-10-CM

## 2021-01-05 DIAGNOSIS — R5383 Other fatigue: Secondary | ICD-10-CM | POA: Diagnosis not present

## 2021-01-05 DIAGNOSIS — M549 Dorsalgia, unspecified: Secondary | ICD-10-CM

## 2021-01-05 DIAGNOSIS — E559 Vitamin D deficiency, unspecified: Secondary | ICD-10-CM

## 2021-01-05 MED ORDER — DOXYCYCLINE HYCLATE 100 MG PO TABS: 100.0000 mg | ORAL_TABLET | Freq: Two times a day (BID) | ORAL | 0 refills | Status: DC

## 2021-01-05 MED ORDER — VITAMIN D (ERGOCALCIFEROL) 1.25 MG (50000 UNIT) PO CAPS: 50000.0000 [IU] | ORAL_CAPSULE | ORAL | 0 refills | Status: AC

## 2021-01-05 NOTE — Progress Notes (Signed)
Subjective:    Patient ID: Jeremiah Mcconnell, male    DOB: 1985/05/26, 35 y.o.   MRN: [id number]  HPI Pt states he recent fatigue. Pt seen on 12-18-2020 by Dr. Drue Novel.  Pt had work up for fatigue.    Pt has extensive work up.  Below is the A/P from Dr. Drue Novel. Fatigue, chest pain, headache, dizziness, testicular pain: Multiple symptoms for the last 4 weeks, exam is benign, I cannot put all the symptoms together under a single diagnosis although I suspect a large part to see it could be a stress, anxiety and perhaps sleep apnea. "Plan: General labs including CMP, CBC, TSH, sed rate, B12, folic acid, vitamin D Brain MRI Follow-up with PCP within the next 2 weeks.  Consider SSRIs or sleep study"   Pt had mentioned to Dr. Drue Novel some testicle pain rt side. Pain for one month. No tx given.   Pt did mention that he had tick on his rt back arm that wife removed beginning of September. Or late august  Pt states tick was very difficult to remove and described tick as very large and engorged. He forgot to mention to Dr. Drue Novel.  Along with fatigue has some ha, mid  back pain and leg pain.      Review of Systems  Constitutional:  Negative for chills, fatigue and fever.  Respiratory:  Negative for cough, chest tightness, shortness of breath and wheezing.   Cardiovascular:  Negative for chest pain and palpitations.  Gastrointestinal:  Negative for abdominal pain, anal bleeding, nausea and rectal pain.  Genitourinary:  Negative for dysuria, flank pain, frequency and genital sores.  Musculoskeletal:  Positive for myalgias. Negative for back pain and neck pain.  Skin:  Negative for rash.   No past medical history on file.   Social History   Socioeconomic History   Marital status: Married    Spouse name: Not on file   Number of children: 1   Years of education: Not on file   Highest education level: Not on file  Occupational History   Not on file  Tobacco Use   Smoking status:  Former   Smokeless tobacco: Not on file  Substance and Sexual Activity   Alcohol use: Yes    Comment: occ beer   Drug use: Not on file   Sexual activity: Not on file  Other Topics Concern   Not on file  Social History Narrative   Not on file   Social Determinants of Health   Financial Resource Strain: Not on file  Food Insecurity: Not on file  Transportation Needs: Not on file  Physical Activity: Not on file  Stress: Not on file  Social Connections: Not on file  Intimate Partner Violence: Not on file    No past surgical history on file.  No family history on file.  No Known Allergies  No current outpatient medications on file prior to visit.   No current facility-administered medications on file prior to visit.    BP (!) 144/66 (BP Location: Right Arm, Cuff Size: Large)   Pulse 70   Temp 98.7 F (37.1 C) (Oral)   Resp 12   Ht 5\' 10"  (1.778 m)   Wt 242 lb 6.4 oz (110 kg)   SpO2 99%   BMI 34.78 kg/m        Objective:   Physical Exam  General- No acute distress. Pleasant patient. Neck- Full range of motion, no jvd Lungs- Clear, even and unlabored. Heart- regular  rate and rhythm. Neurologic- CNII- XII grossly intact.  Rt arm- back side below shoulder/upper tricep area hyperpigmented spot where large tick was attached. Genital- normal penis. Testicles normal in size, no masses. No hernia on either side. Rt testicle faint tender.        Assessment & Plan:   Patient Instructions  For rt arm tick bite with various symptoms that came on post bite will rx doxycycline antibiotic pending tick bite studies.   For vit D deficiency resent vit d rx. The fatigue you have experienced may be from low vit d.  For rt testicle for months will see if you respond to doxycycline antibiotic. If not then will order scrotal US and doppler. If pain worsens or changes notify me.  Dr. Drue Novel order mri of your head. Call [phone]. Have English speaking relative ask if you can  go ahead and get done? Not sure if with your insurance you need prior authorization.   Follow up 2 weeks or sooner if needed   Time spent with patient today was 31  minutes which consisted of chart review, discussing diagnosis, work up treatment and documentation.

## 2021-01-05 NOTE — Patient Instructions (Addendum)
For rt arm tick bite with various symptoms that came on post bite will rx doxycycline antibiotic pending tick bite studies.   For vit D deficiency resent vit d rx. The fatigue you have experienced may be from low vit d.  For rt testicle for months will see if you respond to doxycycline antibiotic. If not then will order scrotal US and doppler. If pain worsens or changes notify me.  Dr. Drue Novel order mri of your head. Call [phone]. Have English speaking relative ask if you can go ahead and get done? Not sure if with your insurance you need prior authorization.   Follow up 2 weeks or sooner if needed

## 2021-01-06 LAB — B. BURGDORFI ANTIBODIES: B burgdorferi Ab IgG+IgM: <0.9 index

## 2021-01-06 LAB — ROCKY MTN SPOTTED FVR ABS PNL(IGG+IGM)
RMSF IgG: NOT DETECTED
RMSF IgM: NOT DETECTED

## 2021-01-20 ENCOUNTER — Ambulatory Visit: Payer: Medicaid Other | Admitting: Medical

## 2021-04-22 ENCOUNTER — Ambulatory Visit: Payer: Medicaid Other | Admitting: Medical

## 2021-04-22 VITALS — BP 133/87 | HR 66 | Temp 98.2°F | Resp 18 | Ht 70.0 in | Wt 238.5 lb

## 2021-04-22 DIAGNOSIS — N433 Hydrocele, unspecified: Secondary | ICD-10-CM

## 2021-04-22 DIAGNOSIS — N50811 Right testicular pain: Secondary | ICD-10-CM

## 2021-04-22 DIAGNOSIS — R1011 Right upper quadrant pain: Secondary | ICD-10-CM

## 2021-04-22 DIAGNOSIS — R030 Elevated blood-pressure reading, without diagnosis of hypertension: Secondary | ICD-10-CM | POA: Diagnosis not present

## 2021-04-22 DIAGNOSIS — R5383 Other fatigue: Secondary | ICD-10-CM

## 2021-04-22 DIAGNOSIS — R079 Chest pain, unspecified: Secondary | ICD-10-CM

## 2021-04-22 DIAGNOSIS — R17 Unspecified jaundice: Secondary | ICD-10-CM | POA: Diagnosis not present

## 2021-04-22 LAB — CBC WITH DIFFERENTIAL/PLATELET
Basophils Absolute: 0 10*3/uL (ref 0.0–0.1)
Basophils Relative: 0.8 % (ref 0.0–3.0)
Eosinophils Absolute: 0.1 10*3/uL (ref 0.0–0.7)
Eosinophils Relative: 1.3 % (ref 0.0–5.0)
HCT: 44.9 % (ref 39.0–52.0)
Hemoglobin: 15.2 g/dL (ref 13.0–17.0)
Lymphocytes Relative: 39.2 % (ref 12.0–46.0)
Lymphs Abs: 2.2 10*3/uL (ref 0.7–4.0)
MCHC: 33.9 g/dL (ref 30.0–36.0)
MCV: 84.9 fl (ref 78.0–100.0)
Monocytes Absolute: 0.3 10*3/uL (ref 0.1–1.0)
Monocytes Relative: 5.8 % (ref 3.0–12.0)
Neutro Abs: 3 10*3/uL (ref 1.4–7.7)
Neutrophils Relative %: 52.9 % (ref 43.0–77.0)
Platelets: 201 10*3/uL (ref 150.0–400.0)
RBC: 5.28 Mil/uL (ref 4.22–5.81)
RDW: 13.5 % (ref 11.5–15.5)
WBC: 5.6 10*3/uL (ref 4.0–10.5)

## 2021-04-22 LAB — COMPREHENSIVE METABOLIC PANEL
ALT: 34 U/L (ref 0–53)
AST: 24 U/L (ref 0–37)
Albumin: 4.7 g/dL (ref 3.5–5.2)
Alkaline Phosphatase: 51 U/L (ref 39–117)
BUN: 15 mg/dL (ref 6–23)
CO2: 31 mEq/L (ref 19–32)
Calcium: 9.7 mg/dL (ref 8.4–10.5)
Chloride: 102 mEq/L (ref 96–112)
Creatinine, Ser: 1.06 mg/dL (ref 0.40–1.50)
GFR: 92.34 mL/min (ref >60.00)
Glucose, Bld: 96 mg/dL (ref 70–99)
Potassium: 4.2 mEq/L (ref 3.5–5.1)
Sodium: 140 mEq/L (ref 135–145)
Total Bilirubin: 1.8 mg/dL — ABNORMAL HIGH (ref 0.2–1.2)
Total Protein: 7.8 g/dL (ref 6.0–8.3)

## 2021-04-22 LAB — LIPID PANEL
Cholesterol: 195 mg/dL (ref 0–200)
HDL: 40.4 mg/dL (ref >39.00)
LDL Cholesterol: 121 mg/dL — ABNORMAL HIGH (ref 0–99)
NonHDL: 154.9
Total CHOL/HDL Ratio: 5
Triglycerides: 171 mg/dL — ABNORMAL HIGH (ref 0.0–149.0)
VLDL: 34.2 mg/dL (ref 0.0–40.0)

## 2021-04-22 LAB — TSH: TSH: 7.9 u[IU]/mL — ABNORMAL HIGH (ref 0.35–5.50)

## 2021-04-22 LAB — T4, FREE: Free T4: 0.71 ng/dL (ref 0.60–1.60)

## 2021-04-22 LAB — TROPONIN I (HIGH SENSITIVITY): High Sens Troponin I: 5 ng/L (ref 2–17)

## 2021-04-22 NOTE — Progress Notes (Signed)
Subjective:    Patient ID: Jeremiah Mcconnell, male    DOB: 1985-06-10, 36 y.o.   MRN: [id number]  HPI  Pt states he had some recent chest pain with some left arm pain. This pain was about 8 days ago. This was at night and pain was severe for about 20 minutes. He could not sleep. Also some left sided neck pain at that time. He was also dizziness. He states severe pain went away.   This week had much lighter pain in chest returned on Saturday and Sunday. Recent chest pain 8 days ago and weekend seems to make him tired per pt report.   Pt states last night his bp was 171/78. He had some ha at that time and some blurred vision. This made him anxious. He used tylenol for ha.  Pt does not smoke. Pt dad passed away at age 60 yo. Had pace maker and MI per pt.  Mild elevated cholesterol. Pt not diabetic. Nonsmoker.  In past pt bp has been mostly controlled.    Review of Systems  Constitutional:  Negative for chills, fatigue and fever.  Respiratory:  Negative for chest tightness, shortness of breath and wheezing.   Cardiovascular:  Negative for chest pain and palpitations.       No pain currentlyu.  Gastrointestinal:  Negative for abdominal pain, blood in stool, constipation and nausea.  Genitourinary:  Positive for testicular pain.       On and off left testicle for month. Last had yesterday. Very brief one second.  Musculoskeletal:  Negative for back pain.  Neurological:  Negative for dizziness, speech difficulty, weakness, numbness and headaches.       Not presently but see hpi.  Psychiatric/Behavioral:  Negative for behavioral problems and confusion. The patient is not nervous/anxious.    No past medical history on file.   Social History   Socioeconomic History   Marital status: Married    Spouse name: Not on file   Number of children: 1   Years of education: Not on file   Highest education level: Not on file  Occupational History   Not on file  Tobacco Use    Smoking status: Former   Smokeless tobacco: Never  Substance and Sexual Activity   Alcohol use: Yes    Comment: occ beer   Drug use: Never   Sexual activity: Yes  Other Topics Concern   Not on file  Social History Narrative   Not on file   Social Determinants of Health   Financial Resource Strain: Not on file  Food Insecurity: Not on file  Transportation Needs: Not on file  Physical Activity: Not on file  Stress: Not on file  Social Connections: Not on file  Intimate Partner Violence: Not on file    No past surgical history on file.  No family history on file.  No Known Allergies  Current Outpatient Medications on File Prior to Visit  Medication Sig Dispense Refill   Vitamin D, Ergocalciferol, (DRISDOL) 1.25 MG (50000 UNIT) CAPS capsule Take 1 capsule (50,000 Units total) by mouth every 7 (seven) days. 12 capsule 0   No current facility-administered medications on file prior to visit.    BP 133/87    Pulse 66    Temp 98.2 F (36.8 C)    Resp 18    Ht 5\' 10"  (1.778 m)    Wt 238 lb 8 oz (108.2 kg)    SpO2 100%    BMI 34.22 kg/m  Objective:   Physical Exam  General Mental Status- Alert. General Appearance- Not in acute distress.   Skin General: Color- Normal Color. Moisture- Normal Moisture.  Neck Carotid Arteries- Normal color. Moisture- Normal Moisture. No carotid bruits. No JVD.  Chest and Lung Exam Auscultation: Breath Sounds:-Normal.  Cardiovascular Auscultation:Rythm- Regular. Murmurs & Other Heart Sounds:Auscultation of the heart reveals- No Murmurs.  Abdomen Inspection:-Inspeection Normal. Palpation/Percussion:Note:No mass. Palpation and Percussion of the abdomen reveal- Non Tender, Non Distended + BS, no rebound or guarding.   Neurologic Cranial Nerve exam:- CN III-XII intact(No nystagmus), symmetric smile. Strength:- 5/5 equal and symmetric strength both upper and lower extremities.   Genital exam- normal but faint rt  side upper  region testicle tender. Maybe feel varicocee.     Assessment & Plan:   Patient Instructions  Chest pain significantly most recently 8 days ago and then again this weekend.  Also recurrent milder chest pain this weekend.  Some fatigue with chest pain events.  We will do 1 set of troponin stat today.  If you have elevated levels will have to advise emergency department evaluation.  Also if you have recurrent significant chest pain after advised emergency department evaluation in that event as well.  We will go ahead and refer to cardiologist to evaluate further in light of your family history and recent described pain.  EKG done today shows sinus rhythm with minimal bradycardia.   Htn-blood pressure is good presently but last night you had significant elevation.  Keep checking blood pressure daily.  If you have BP elevation above 150/90 let me know.  If with BP elevation you have any cardiac or neurologic signs and symptoms and be seen in the emergency department.  Want you to follow-up early next week to review your daily blood pressure readings and then determine if she might need blood pressure medication.  Avoid caffeinated beverages.  Some fatigue recently so ordered CBC, CMP TSH and T4.  You also mentioned some recurrent intermittent transient brief testicle pain.  None presently but you did have some last night.  I am going to place order for ultrasound scrotum.  Then decide if you need referral to urologist.  Follow-up in 5 to 7 days or sooner if needed.   Esperanza Richters, PA-C

## 2021-04-22 NOTE — Patient Instructions (Addendum)
Chest pain significantly most recently 8 days ago and then again this weekend.  Also recurrent milder chest pain this weekend.  Some fatigue with chest pain events.  We will do 1 set of troponin stat today.  If you have elevated levels will have to advise emergency department evaluation.  Also if you have recurrent significant chest pain after advised emergency department evaluation in that event as well.  We will go ahead and refer to cardiologist to evaluate further in light of your family history and recent described pain.  EKG done today shows sinus rhythm with minimal bradycardia.   Htn-blood pressure is good presently but last night you had significant elevation.  Keep checking blood pressure daily.  If you have BP elevation above 150/90 let me know.  If with BP elevation you have any cardiac or neurologic signs and symptoms and be seen in the emergency department.  Want you to follow-up early next week to review your daily blood pressure readings and then determine if she might need blood pressure medication.  Avoid caffeinated beverages.  Some fatigue recently so ordered CBC, CMP TSH and T4.  You also mentioned some recurrent intermittent transient brief testicle pain.  None presently but you did have some last night.  I am going to place order for ultrasound scrotum.  Then decide if you need referral to urologist.  Follow-up in 5 to 7 days or sooner if needed.

## 2021-04-23 ENCOUNTER — Encounter: Payer: Self-pay | Admitting: Medical

## 2021-04-23 ENCOUNTER — Ambulatory Visit (HOSPITAL_BASED_OUTPATIENT_CLINIC_OR_DEPARTMENT_OTHER)
Admission: RE | Admit: 2021-04-23 | Discharge: 2021-04-23 | Disposition: A | Discharge status: Home / Self Care | Payer: Medicaid Other | Source: Ambulatory Visit | Attending: Medical | Admitting: Medical

## 2021-04-23 ENCOUNTER — Other Ambulatory Visit: Payer: Self-pay

## 2021-04-23 DIAGNOSIS — N50811 Right testicular pain: Secondary | ICD-10-CM | POA: Diagnosis not present

## 2021-04-23 DIAGNOSIS — N433 Hydrocele, unspecified: Secondary | ICD-10-CM | POA: Diagnosis not present

## 2021-04-23 DIAGNOSIS — I861 Scrotal varices: Secondary | ICD-10-CM | POA: Diagnosis not present

## 2021-04-23 NOTE — Addendum Note (Signed)
Addended by: Gwenevere Abbot on: 04/23/2021 12:51 PM   Modules accepted: Orders

## 2021-04-24 NOTE — Addendum Note (Signed)
Addended by: Gwenevere Abbot on: 04/24/2021 07:06 AM   Modules accepted: Orders

## 2021-04-28 NOTE — Telephone Encounter (Signed)
Patient called back, he was informed urology referral was made and number was given.

## 2021-05-03 NOTE — Telephone Encounter (Signed)
Patient has been scheduled to see cardiology 05-28-21  and urology 07-16-21. All information with date, time and locations sent to patient in spanish on a MyChart message.

## 2021-05-17 DIAGNOSIS — Z20822 Contact with and (suspected) exposure to covid-19: Secondary | ICD-10-CM | POA: Diagnosis not present

## 2021-05-28 ENCOUNTER — Ambulatory Visit: Payer: Medicaid Other | Admitting: Cardiology

## 2021-07-16 ENCOUNTER — Ambulatory Visit: Payer: Medicaid Other | Admitting: Medical

## 2021-07-16 VITALS — BP 119/76 | HR 60 | Temp 98.2°F | Resp 18 | Ht 70.0 in | Wt 224.0 lb

## 2021-07-16 DIAGNOSIS — M546 Pain in thoracic spine: Secondary | ICD-10-CM

## 2021-07-16 DIAGNOSIS — R1011 Right upper quadrant pain: Secondary | ICD-10-CM

## 2021-07-16 LAB — COMPREHENSIVE METABOLIC PANEL
ALT: 24 U/L (ref 0–53)
AST: 20 U/L (ref 0–37)
Albumin: 4.9 g/dL (ref 3.5–5.2)
Alkaline Phosphatase: 57 U/L (ref 39–117)
BUN: 14 mg/dL (ref 6–23)
CO2: 28 mEq/L (ref 19–32)
Calcium: 9.4 mg/dL (ref 8.4–10.5)
Chloride: 104 mEq/L (ref 96–112)
Creatinine, Ser: 0.96 mg/dL (ref 0.40–1.50)
GFR: 103.83 mL/min (ref >60.00)
Glucose, Bld: 85 mg/dL (ref 70–99)
Potassium: 4.4 mEq/L (ref 3.5–5.1)
Sodium: 138 mEq/L (ref 135–145)
Total Bilirubin: 1.6 mg/dL — ABNORMAL HIGH (ref 0.2–1.2)
Total Protein: 7.5 g/dL (ref 6.0–8.3)

## 2021-07-16 LAB — CBC WITH DIFFERENTIAL/PLATELET
Basophils Absolute: 0 10*3/uL (ref 0.0–0.1)
Basophils Relative: 0.8 % (ref 0.0–3.0)
Eosinophils Absolute: 0.1 10*3/uL (ref 0.0–0.7)
Eosinophils Relative: 2.2 % (ref 0.0–5.0)
HCT: 44.3 % (ref 39.0–52.0)
Hemoglobin: 14.7 g/dL (ref 13.0–17.0)
Lymphocytes Relative: 46.1 % — ABNORMAL HIGH (ref 12.0–46.0)
Lymphs Abs: 2.8 10*3/uL (ref 0.7–4.0)
MCHC: 33.2 g/dL (ref 30.0–36.0)
MCV: 87.1 fl (ref 78.0–100.0)
Monocytes Absolute: 0.3 10*3/uL (ref 0.1–1.0)
Monocytes Relative: 5.6 % (ref 3.0–12.0)
Neutro Abs: 2.8 10*3/uL (ref 1.4–7.7)
Neutrophils Relative %: 45.3 % (ref 43.0–77.0)
Platelets: 187 10*3/uL (ref 150.0–400.0)
RBC: 5.09 Mil/uL (ref 4.22–5.81)
RDW: 13.9 % (ref 11.5–15.5)
WBC: 6.2 10*3/uL (ref 4.0–10.5)

## 2021-07-16 LAB — LIPASE: Lipase: 45 U/L (ref 11.0–59.0)

## 2021-07-16 MED ORDER — FAMOTIDINE 20 MG PO TABS: 20.0000 mg | ORAL_TABLET | Freq: Every day | ORAL | 0 refills | Status: AC

## 2021-07-16 NOTE — Patient Instructions (Signed)
Rt side abdomen pain with belching. Will get cbc,cmp, lipase and h pylori breath test. Also will go ahead and get [place] of abdomen. Rx famotadine 20 mg daily and eat healthy diet. Will up date you on labs. ? ?For back pain will get thoracic spine xray. Then decide if additional med needed. ? ?Follow up 10-14 days or sooner if needed. ? ? ?

## 2021-07-16 NOTE — Progress Notes (Signed)
? ?Subjective:  ? ? Patient ID: Jeremiah Mcconnell, male    DOB: [birth year], 36 y.o.   MRN: [id number] ? ?HPI ? ?Pt in with some rt side abdomen pain and some back pain as well.  ? ?Pt tells me had rt side abdomen/flank  pain for 3 weeks. Pt states pain not associated after eating.  He states rt side pain is mostly on certain movements. Pt worked Psychologist, clinical but now Associate Professor. ? ?He does also note at time with abd pain will burp. Notes daily belching. ? ?Pt also notes pain in his back. He describes lower thoracic paraspinal pain. ? ?Pt does report occasional pain in rt side abdomen with back pain. ? ? ? ? ?Review of Systems  ?Constitutional:  Negative for chills, fatigue and fever.  ?Respiratory:  Negative for cough, chest tightness, shortness of breath and wheezing.   ?Cardiovascular:  Negative for chest pain and palpitations.  ?Gastrointestinal:  Positive for abdominal pain. Negative for diarrhea, nausea and vomiting.  ?     Rt upper quadrant region pain.  ?Musculoskeletal:  Positive for back pain.  ?Skin:  Negative for pallor.  ? ? ?No past medical history on file. ?  ?Social History  ? ?Socioeconomic History  ? Marital status: Married  ?  Spouse name: Not on file  ? Number of children: 1  ? Years of education: Not on file  ? Highest education level: Not on file  ?Occupational History  ? Not on file  ?Tobacco Use  ? Smoking status: Former  ? Smokeless tobacco: Never  ?Substance and Sexual Activity  ? Alcohol use: Yes  ?  Comment: occ beer  ? Drug use: Never  ? Sexual activity: Yes  ?Other Topics Concern  ? Not on file  ?Social History Narrative  ? Not on file  ? ?Social Determinants of Health  ? ?Financial Resource Strain: Not on file  ?Food Insecurity: Not on file  ?Transportation Needs: Not on file  ?Physical Activity: Not on file  ?Stress: Not on file  ?Social Connections: Not on file  ?Intimate Partner Violence: Not on file  ? ? ?No past surgical history on file. ? ?No family  history on file. ? ?No Known Allergies ? ?Current Outpatient Medications on File Prior to Visit  ?Medication Sig Dispense Refill  ? Vitamin D, Ergocalciferol, (DRISDOL) 1.25 MG (50000 UNIT) CAPS capsule Take 1 capsule (50,000 Units total) by mouth every 7 (seven) days. 12 capsule 0  ? ?No current facility-administered medications on file prior to visit.  ? ? ?BP 119/76   Pulse 60   Temp 98.2 ?F (36.8 ?C)   Resp 18   Ht 5\' 10"  (1.778 m)   Wt 224 lb (101.6 kg)   SpO2 100%   BMI 32.14 kg/m?  ?  ?   ?Objective:  ? Physical Exam ? ?General- No acute distress. Pleasant patient. ?Neck- Full range of motion, no jvd ?Lungs- Clear, even and unlabored. ?Heart- regular rate and rhythm. ?Neurologic- CNII- XII grossly intact.  ?Abdomen- mild rt upper quadrant area pain more toward lower rib region. Otherwise, +bs, no rebound or guarding. No organomegaly.  ? ?Back- para thoracic area tenderness. No rash. No cva tenderness. ? ? ?   ?Assessment & Plan:  ? ?Patient Instructions  ?Rt side abdomen pain with belching. Will get cbc,cmp, lipase and h pylori breath test. Also will go ahead and get of abdomen. Rx famotadine 20 mg daily and eat healthy diet. Will  up date you on labs. ? ?For back pain will get thoracic spine xray. Then decide if additional med needed. ? ?Follow up 10-14 days or sooner if needed. ? ?  ?Esperanza Richters, PA-C  ?

## 2021-07-16 NOTE — Addendum Note (Signed)
Addended by: Rosita Kea on: 07/16/2021 12:01 PM ? ? Modules accepted: Orders ? ?

## 2021-07-19 ENCOUNTER — Ambulatory Visit (HOSPITAL_BASED_OUTPATIENT_CLINIC_OR_DEPARTMENT_OTHER)
Admission: RE | Admit: 2021-07-19 | Discharge: 2021-07-19 | Disposition: A | Discharge status: Home / Self Care | Payer: Medicaid Other | Source: Ambulatory Visit | Attending: Medical | Admitting: Medical

## 2021-07-19 DIAGNOSIS — M546 Pain in thoracic spine: Secondary | ICD-10-CM | POA: Diagnosis not present

## 2021-07-19 DIAGNOSIS — R1011 Right upper quadrant pain: Secondary | ICD-10-CM | POA: Diagnosis not present

## 2021-07-19 DIAGNOSIS — K760 Fatty (change of) liver, not elsewhere classified: Secondary | ICD-10-CM | POA: Diagnosis not present

## 2021-07-19 LAB — H. PYLORI BREATH TEST: H. pylori Breath Test: NOT DETECTED

## 2021-07-22 ENCOUNTER — Telehealth (HOSPITAL_BASED_OUTPATIENT_CLINIC_OR_DEPARTMENT_OTHER): Payer: Self-pay

## 2021-07-22 ENCOUNTER — Other Ambulatory Visit: Payer: Self-pay

## 2021-07-22 DIAGNOSIS — R7989 Other specified abnormal findings of blood chemistry: Secondary | ICD-10-CM

## 2021-07-22 NOTE — Addendum Note (Signed)
Addended by: Gwenevere Abbot on: 07/22/2021 02:10 PM ? ? Modules accepted: Orders ? ?

## 2021-07-26 ENCOUNTER — Other Ambulatory Visit (INDEPENDENT_AMBULATORY_CARE_PROVIDER_SITE_OTHER): Payer: Medicaid Other

## 2021-07-26 DIAGNOSIS — R7989 Other specified abnormal findings of blood chemistry: Secondary | ICD-10-CM | POA: Diagnosis not present

## 2021-07-26 LAB — TSH: TSH: 6.22 u[IU]/mL — ABNORMAL HIGH (ref 0.35–5.50)

## 2021-07-26 LAB — T4, FREE: Free T4: 0.78 ng/dL (ref 0.60–1.60)

## 2021-07-28 LAB — THYROID PEROXIDASE ANTIBODY: Thyroperoxidase Ab SerPl-aCnc: 1 IU/mL (ref <9)

## 2021-11-29 ENCOUNTER — Ambulatory Visit: Payer: Medicaid Other | Admitting: Medical

## 2021-11-29 ENCOUNTER — Encounter: Payer: Self-pay | Admitting: Medical

## 2021-11-29 VITALS — BP 139/80 | HR 60 | Temp 98.0°F | Resp 18 | Ht 70.0 in | Wt 237.2 lb

## 2021-11-29 DIAGNOSIS — R030 Elevated blood-pressure reading, without diagnosis of hypertension: Secondary | ICD-10-CM | POA: Diagnosis not present

## 2021-11-29 DIAGNOSIS — M5440 Lumbago with sciatica, unspecified side: Secondary | ICD-10-CM

## 2021-11-29 DIAGNOSIS — R7989 Other specified abnormal findings of blood chemistry: Secondary | ICD-10-CM

## 2021-11-29 LAB — T4, FREE: Free T4: 0.67 ng/dL (ref 0.60–1.60)

## 2021-11-29 LAB — TSH: TSH: 11.43 u[IU]/mL — ABNORMAL HIGH (ref 0.35–5.50)

## 2021-11-29 MED ORDER — CYCLOBENZAPRINE HCL 5 MG PO TABS: ORAL_TABLET | ORAL | 1 refills | Status: DC

## 2021-11-29 MED ORDER — MELOXICAM 7.5 MG PO TABS: 7.5000 mg | ORAL_TABLET | Freq: Every day | ORAL | 0 refills | Status: DC

## 2021-11-29 NOTE — Progress Notes (Addendum)
Subjective:    Patient ID: Jeremiah Mcconnell, male    DOB: 10-11-1985, 36 y.o.   MRN: [id number]  HPI  Pt in for lower back pain. Pt states pain was mild at first 2 weeks ago. Gradually getting worse. No accident or fall. 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 Pt taking tylenol over the counter. Pt states pain is constant. Uncomfortable driving in car. Some pain in lower back radiating to left thigh as well.   Pt also states has been checking his bp at home. Pt states when checks bp 2 times a high 140/90 range.(Close to 150/90 at times).   In May tsh was elevated. But t4 and thyroid peroxidase antibody was normal.   Review of Systems  Constitutional:  Negative for chills, fatigue and fever.  Respiratory:  Negative for cough, chest tightness, shortness of breath and wheezing.   Cardiovascular:  Negative for chest pain and palpitations.  Gastrointestinal:  Negative for abdominal pain.  Genitourinary:  Negative for dysuria.  Musculoskeletal:  Positive for gait problem. Negative for back pain.  Skin:  Negative for rash.  Neurological:  Negative for dizziness, light-headedness and headaches.  Hematological:  Negative for adenopathy. Does not bruise/bleed easily.  Psychiatric/Behavioral:  Positive for hallucinations. Negative for behavioral problems, dysphoric mood, sleep disturbance and suicidal ideas. The patient is not nervous/anxious.     No past medical history on file.   Social History   Socioeconomic History   Marital status: Married    Spouse name: Not on file   Number of children: 1   Years of education: Not on file   Highest education level: Not on file  Occupational History   Not on file  Tobacco Use   Smoking status: Former   Smokeless tobacco: Never  Substance and Sexual Activity   Alcohol use: Yes    Comment: occ beer   Drug use: Never   Sexual activity: Yes  Other Topics Concern   Not on file  Social History Narrative   Not on file    Social Determinants of Health   Financial Resource Strain: Not on file  Food Insecurity: Not on file  Transportation Needs: Not on file  Physical Activity: Not on file  Stress: Not on file  Social Connections: Not on file  Intimate Partner Violence: Not on file    No past surgical history on file.  No family history on file.  No Known Allergies  Current Outpatient Medications on File Prior to Visit  Medication Sig Dispense Refill   famotidine (PEPCID) 20 MG tablet Take 1 tablet (20 mg total) by mouth daily. 30 tablet 0   Vitamin D, Ergocalciferol, (DRISDOL) 1.25 MG (50000 UNIT) CAPS capsule Take 1 capsule (50,000 Units total) by mouth every 7 (seven) days. 12 capsule 0   No current facility-administered medications on file prior to visit.    BP (!) 140/80   Pulse 60   Temp 98 F (36.7 C)   Resp 18   Ht 5\' 10"  (1.778 m)   Wt 237 lb 3.2 oz (107.6 kg)   SpO2 98%   BMI 34.03 kg/m        Objective:   Physical Exam   General Mental Status- Alert. General Appearance- Not in acute distress.   Skin General: Color- Normal Color. Moisture- Normal Moisture.  Neck Carotid Arteries- Normal color. Moisture- Normal Moisture. No carotid bruits. No JVD.  Chest and Lung Exam Auscultation: Breath Sounds:-Normal.  Cardiovascular Auscultation:Rythm- Regular. Murmurs & Other  Heart Sounds:Auscultation of the heart reveals- No Murmurs.  Abdomen Inspection:-Inspeection Normal. Palpation/Percussion:Note:No mass. Palpation and Percussion of the abdomen reveal- Non Tender, Non Distended + BS, no rebound or guarding.   Neurologic Cranial Nerve exam:- CN III-XII intact(No nystagmus), symmetric smile. Strength:- 5/5 equal and symmetric strength both upper and lower extremities.       Back Mid lumbar spine tenderness to palpation. Pain on straight leg lift. Pain on lateral movements and flexion/extension of the spine.  Lower ext neurologic  L5-S1 sensation intact  bilaterally. Normal patellar reflexes bilaterally. No foot drop bilaterally.     Assessment & Plan:   Patient Instructions  Low back pain with some radicular pain. Will get lumbar spine xray. Will prescribe meloxicam 7.5 mg daily for 10 days. Will provide low dose flexeril to use at night prior to sleep. Back stretching exercises as well. If back pain persists consider referral to sport med.  Borderline bp elevation. I do want you to check bp daily for one week and then update me with those readings. But check after finished with meloxicam as nsaids an increase bp levels.  For increased tsh will recheck thyroid labs today.  Follow up in 3 weeks or sooner if needed.   Esperanza Richters, PA-C

## 2021-11-29 NOTE — Patient Instructions (Addendum)
Low back pain with some radicular pain. Will get lumbar spine xray. Will prescribe meloxicam 7.5 mg daily for 10 days. Will provide low dose flexeril to use at night prior to sleep. Back stretching exercises as well. If back pain persists consider referral to sport med.  Borderline bp elevation. I do want you to check bp daily for one week and then update me with those readings. But check after finished with meloxicam as nsaids an increase bp levels.  Check bp if necessary over next 10 days but keep in mind nsaids increase bp.  For increased tsh will recheck thyroid labs today.  Follow up in 3 weeks or sooner if needed.   Ejercicios para la espalda Back Exercises Estos ejercicios ayudan a fortalecer el tronco y la espalda. Adems, ayudan a mantener la flexibilidad de la zona lumbar. Hacer estos ejercicios puede ser de ayuda para evitar o Advertising copywriter. Si tiene dolor de espalda, trate de hacer estos ejercicios 2 o 3 veces por da, o como se lo haya indicado el mdico. A medida que [place], haga los ejercicios una vez por da. Repita los ejercicios con ms frecuencia como se lo haya indicado el mdico. Para evitar que el dolor regrese, haga los ejercicios una vez por da o como se lo haya indicado el mdico. Haga los ejercicios exactamente como se lo haya indicado el mdico. Detngase de inmediato si siente un dolor repentino o el dolor empeora. Ejercicios Rodilla al pecho Repita estos pasos 3 a 5 veces seguidas con cada pierna: Acustese boca arriba sobre una cama dura o sobre el suelo con las piernas extendidas. Lleve una rodilla al pecho. Agarre la rodilla o el muslo con ambas manos y sostngalo. Tire de la rodilla hasta sentir una elongacin suave en la parte baja de la espalda o las nalgas. Mantenga la elongacin durante 10 a 30 segundos. Suelte y extienda la pierna lentamente. Inclinacin de la pelvis Repita estos pasos 5 a 10 veces seguidas: Acustese boca arriba sobre una  cama dura o sobre el suelo con las piernas extendidas. Flexione las rodillas de manera que apunten al techo. Los pies deben estar apoyados en el suelo. Contraiga los msculos de la parte baja del vientre (abdomen) para empujar la zona lumbar contra el suelo. Este movimiento har que el coxis apunte hacia el techo, en lugar de apuntar hacia abajo en direccin a los pies o al suelo. Mantenga esta posicin durante 5 a 10 segundos mientras contrae suavemente los msculos y respira con normalidad. El perro y el gato Repita estos pasos hasta que la zona lumbar se curve con ms facilidad: Colquese con [employer] y las rodillas sobre una cama dura o sobre el suelo. Las manos deben estar alineadas con los hombros y las rodillas con las caderas. Puede colocarse almohadillas debajo de las rodillas. Deje que la cabeza cuelgue hacia el pecho. Tense (contraiga) los msculos del vientre. Baje el coxis en direccin al suelo de modo que la zona lumbar se arquee como el lomo de un [place]. Mantenga esta posicin durante 5 segundos. [place] lentamente la cabeza. Relaje los msculos del vientre. Eleve el coxis de modo que apunte en direccin al techo para que la espalda forme un arco hundido como el lomo de un perro contento. Mantenga esta posicin durante 5 segundos.  Flexiones de Public house manager pasos 5 a 10 veces seguidas: Acustese sobre la panza (boca abajo) sobre una cama dura o sobre el suelo. Ponga las manos cerca  de la cabeza, separadas aproximadamente al ancho de los hombros. Con la espalda relajada y las caderas apoyadas en el suelo, extienda lentamente los brazos para levantar la mitad superior del cuerpo y Optometrist los hombros. No use los msculos de la espalda. Para estar ms cmodo, puede cambiar la [employer]. Mantenga esta posicin durante 5 segundos. Mantenga la espalda relajada. Lentamente vuelva a la posicin horizontal.  Puentes Repita estos pasos 10 veces  seguidas: Acustese boca arriba sobre una cama firme o sobre el suelo. Flexione las rodillas de manera que apunten al techo. Los pies deben estar apoyados en el suelo. Los brazos deben estar paralelos a los costados del cuerpo, junto al cuerpo. Contraiga los glteos y despegue las nalgas del suelo hasta que la cintura est casi a la altura de las rodillas. Si no siente el trabajo muscular en las nalgas y la parte posterior de los muslos, aleje los pies 1 a 2 pulgadas (2,5 a 5 centmetros) de las nalgas. Mantenga esta posicin durante 3 a 5 segundos. Lentamente, vuelva a apoyar las nalgas en el suelo y relaje los glteos. Si este ejercicio le resulta muy fcil, intente realizarlo con los brazos cruzados [place]. Abdominales Repita estos pasos 5 a 10 veces seguidas: Acustese boca arriba sobre una cama dura o sobre el suelo con las piernas extendidas. Flexione las rodillas de manera que apunten al techo. Los pies deben estar apoyados en el suelo. Cruce los [employer]. Baje levemente el mentn en direccin al pecho, pero no doble el cuello. Contraiga los msculos del abdomen y con lentitud eleve el pecho lo suficiente como para despegar levemente los omplatos del suelo. Evite levantar el cuerpo ms alto que eso, porque puede sobreexigir la zona lumbar. Lentamente baje el pecho y la cabeza hasta el suelo. Elevaciones de espalda Repita estos pasos 5 a 10 veces seguidas: Acustese sobre la panza (boca abajo) con los brazos a los costados y apoye la frente en el suelo. Contraiga los msculos de las piernas y los glteos. Lentamente despegue el pecho del suelo mientras mantiene las caderas apoyadas en el suelo. Mantenga la nuca alineada con la curvatura de la espalda. Mire hacia el suelo mientras hace este ejercicio. Mantenga esta posicin durante 3 a 5 segundos. Lentamente baje el pecho y el rostro hasta el suelo. Comunquese con un mdico si: El dolor de espalda se vuelve mucho  ms intenso cuando hace un ejercicio. El dolor de espalda no mejora 2 horas despus de [employer] ejercicios. Si tiene alguno de [employer], deje de [employer] ejercicios. No vuelva a hacer los ejercicios a menos que el mdico lo autorice. Solicite ayuda de inmediato si: Siente un dolor sbito y muy intenso en la espalda. Si esto ocurre, deje de [employer]. No vuelva a hacer los ejercicios a menos que el mdico lo autorice. Esta informacin no tiene Theme park manager el consejo del mdico. Asegrese de hacerle al mdico cualquier pregunta que tenga. Document Revised: 06/09/2020 Document Reviewed: 06/09/2020 Elsevier Patient Education  2023 [employer].

## 2021-12-01 LAB — THYROID PEROXIDASE ANTIBODY: Thyroperoxidase Ab SerPl-aCnc: 2 IU/mL (ref <9)

## 2021-12-21 DIAGNOSIS — M5442 Lumbago with sciatica, left side: Secondary | ICD-10-CM | POA: Diagnosis not present

## 2021-12-21 DIAGNOSIS — G8929 Other chronic pain: Secondary | ICD-10-CM | POA: Diagnosis not present

## 2021-12-21 DIAGNOSIS — M5441 Lumbago with sciatica, right side: Secondary | ICD-10-CM | POA: Diagnosis not present

## 2022-01-07 ENCOUNTER — Telehealth: Payer: Self-pay

## 2022-01-07 NOTE — Telephone Encounter (Signed)
Patient called to reports his back pain is not better, was seen at ED earlier this month for the same.  Patient reports  he was advsied per pcp to call back if pain persist so that he can get some imagine.   Please initiate order for imagine if indicated and forward back so I can let patient know. thanks

## 2022-01-07 NOTE — Addendum Note (Signed)
Addended by: Anabel Halon on: 01/07/2022 04:35 PM   Modules accepted: Orders

## 2022-01-10 NOTE — Telephone Encounter (Signed)
Called but no answer, lvm for patient to call back  This message was recorded in spanish

## 2022-01-13 NOTE — Telephone Encounter (Signed)
Called patient back but no answer 

## 2022-01-13 NOTE — Telephone Encounter (Signed)
Called but no answer, lvm (in spanish) 

## 2022-01-13 NOTE — Telephone Encounter (Signed)
Pt was returning call and stated can call him back at [facility] [phone].

## 2022-01-14 ENCOUNTER — Ambulatory Visit (HOSPITAL_BASED_OUTPATIENT_CLINIC_OR_DEPARTMENT_OTHER)
Admission: RE | Admit: 2022-01-14 | Discharge: 2022-01-14 | Disposition: A | Discharge status: Home / Self Care | Payer: Medicaid Other | Source: Ambulatory Visit | Attending: Medical | Admitting: Medical

## 2022-01-14 DIAGNOSIS — M5440 Lumbago with sciatica, unspecified side: Secondary | ICD-10-CM | POA: Diagnosis not present

## 2022-01-14 DIAGNOSIS — M545 Low back pain, unspecified: Secondary | ICD-10-CM | POA: Diagnosis not present

## 2022-01-14 NOTE — Telephone Encounter (Signed)
Patient advised of order.  He reports that he did not see information about orthopaedics or has an appointment with any specialist.  He will try to get xray today

## 2022-01-18 ENCOUNTER — Ambulatory Visit: Payer: Medicaid Other | Admitting: Medical

## 2022-01-18 ENCOUNTER — Encounter: Payer: Self-pay | Admitting: Medical

## 2022-01-18 VITALS — BP 140/88 | HR 58 | Temp 98.0°F | Resp 18 | Ht 70.0 in | Wt 237.0 lb

## 2022-01-18 DIAGNOSIS — R030 Elevated blood-pressure reading, without diagnosis of hypertension: Secondary | ICD-10-CM

## 2022-01-18 DIAGNOSIS — M5440 Lumbago with sciatica, unspecified side: Secondary | ICD-10-CM

## 2022-01-18 MED ORDER — CYCLOBENZAPRINE HCL 5 MG PO TABS: ORAL_TABLET | ORAL | 0 refills | Status: AC

## 2022-01-18 MED ORDER — PREDNISONE 10 MG (21) PO TBPK: ORAL_TABLET | ORAL | 0 refills | Status: AC

## 2022-01-18 NOTE — Patient Instructions (Addendum)
Back pain for for about 6 weeks.  Pain in lumbar region with some sciatica features.  Lumbar spine x-ray done.  Some degenerative changes noted.  Not responding to meloxicam.  I will prescribe 6-day tapered prednisone with limited prescription Flexeril 5 mg to use at night.  Back stretching exercises as tolerated.  I went ahead and placed referral to sports medicine MD.  Blood pressure is elevated today.  Previously prior to back pain blood pressure was better controlled.  Want you to check your blood pressure daily over next 4 to 5 days.  Send me a MyChart update on daily blood pressure readings.  If pain resolved and blood pressure still elevated would recommend a BP medication.  Hopefully you will feel better and be able to return to work on Thursday.  Let me know on Friday how is going.  Asking for your sports medicine appointment to be in about 10 days.  If you have persisting pain definitely keep that appointment.  If your pain were to resolve completely then you could cancel.  Follow-up with me as needed.

## 2022-01-18 NOTE — Progress Notes (Signed)
Subjective:    Patient ID: Jeremiah Mcconnell, male    DOB: [birth year], 36 y.o.   MRN: [id number]  HPI  Pt in for follow up.  He has about 6 weeks or more of back pain.  11-29-21 A/P  "Low back pain with some radicular pain. Will get lumbar spine xray. Will prescribe meloxicam 7.5 mg daily for 10 days. Will provide low dose flexeril to use at night prior to sleep. Back stretching exercises as well. If back pain persists consider referral to sport med."  Pt states when he used meloxicam helped just little   Later pt went to ED on 12-21-21  ED impresssion  ED Clinical Impression  Clinical picture was discussed with patient along with risks and benefits of management options, shared decision making will discharge the patient home with close outpatient follow-up for continued management.  If patient was given medication(s), adverse effects, black box warnings, and drug interactions were discussed with patient. Patient instructed to follow-up here in the emergency department for new or worsening symptoms.   1. Chronic bilateral low back pain with right-sided sciatica    Pt given tramadol injection and licodaine patch. Also given muscle relaxant.  Pt states pain more frequent and on position change. Pain level a lot of time 7/10.Pain in lumbar and at times radiates to left hamstring.  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    Lumbar xray.  Degenerative changes are noted at the L5-S1 level with mild disc space narrowing and facet hypertrophy.   Htn- bp high.   Review of Systems  Constitutional:  Negative for chills, fatigue and fever.  Respiratory:  Negative for cough, chest tightness, shortness of breath and wheezing.   Cardiovascular:  Negative for chest pain and palpitations.  Gastrointestinal:  Negative for abdominal pain and nausea.  Genitourinary:  Negative for dysuria.  Musculoskeletal:  Positive for back pain.  Negative for arthralgias and neck pain.  Skin:  Negative for rash.  Neurological:  Negative for facial asymmetry, speech difficulty and light-headedness.  Hematological:  Negative for adenopathy.  Psychiatric/Behavioral:  Negative for behavioral problems, confusion and dysphoric mood. The patient is not nervous/anxious and is not hyperactive.     No past medical history on file.   Social History   Socioeconomic History   Marital status: Married    Spouse name: Not on file   Number of children: 1   Years of education: Not on file   Highest education level: Not on file  Occupational History   Not on file  Tobacco Use   Smoking status: Former   Smokeless tobacco: Never  Substance and Sexual Activity   Alcohol use: Yes    Comment: occ beer   Drug use: Never   Sexual activity: Yes  Other Topics Concern   Not on file  Social History Narrative   Not on file   Social Determinants of Health   Financial Resource Strain: Not on file  Food Insecurity: Not on file  Transportation Needs: Not on file  Physical Activity: Not on file  Stress: Not on file  Social Connections: Not on file  Intimate Partner Violence: Not on file    No past surgical history on file.  No family history on file.  No Known Allergies  Current Outpatient Medications on File Prior to Visit  Medication Sig Dispense Refill   famotidine (PEPCID) 20 MG tablet Take 1 tablet (20 mg total) by mouth daily. Rea  tablet 0   meloxicam (MOBIC) 7.5 MG tablet Take 1 tablet (7.5 mg total) by mouth daily. 10 tablet 0   Vitamin D, Ergocalciferol, (DRISDOL) 1.25 MG (50000 UNIT) CAPS capsule Take 1 capsule (50,000 Units total) by mouth every 7 (seven) days. 12 capsule 0   No current facility-administered medications on file prior to visit.    BP (!) 140/88   Pulse (!) 58   Temp 98 F (36.7 C)   Resp 18   Ht 5\' 10"  (1.778 m)   Wt 237 lb (107.5 kg)   SpO2 99%   BMI 34.01 kg/m        Objective:   Physical  Exam  General Appearance- Not in acute distress.    Chest and Lung Exam Auscultation: Breath sounds:-Normal. Clear even and unlabored. Adventitious sounds:- No Adventitious sounds.  Cardiovascular Auscultation:Rythm - Regular, rate and rythm. Heart Sounds -Normal heart sounds.  Abdomen Inspection:-Inspection Normal.  Palpation/Perucssion: Palpation and Percussion of the abdomen reveal- Non Tender, No Rebound tenderness, No rigidity(Guarding) and No Palpable abdominal masses.  Liver:-Normal.  Spleen:- Normal.   Back Mid lumbar spine tenderness to palpation.  Also bilateral SI tenderness to palpation. Pain on straight leg lift. Pain on lateral movements and flexion/extension of the spine.  Lower ext neurologic  L5-S1 sensation intact bilaterally. Normal patellar reflexes bilaterally. No foot drop bilaterally.       Assessment & Plan:   Patient Instructions  Back pain for for about 6 weeks.  Pain in lumbar region with some sciatica features.  Lumbar spine x-ray done.  Some degenerative changes noted.  Not responding to meloxicam.  I will prescribe 6-day tapered prednisone with limited prescription Flexeril 5 mg to use at night.  Back stretching exercises as tolerated.  I went ahead and placed referral to sports medicine MD.  Blood pressure is elevated today.  Previously prior to back pain blood pressure was better controlled.  Want you to check your blood pressure daily over next 4 to 5 days.  Send me a MyChart update on daily blood pressure readings.  If pain resolved and blood pressure still elevated would recommend a BP medication.  Hopefully you will feel better and be able to return to work on Thursday.  Let me know on Friday how is going.  Asking for your sports medicine appointment to be in about 10 days.  If you have persisting pain definitely keep that appointment.  If your pain were to resolve completely then you could cancel.  Follow-up with me as needed.

## 2022-02-01 ENCOUNTER — Ambulatory Visit: Payer: Medicaid Other | Admitting: Family Medicine

## 2022-03-03 ENCOUNTER — Ambulatory Visit: Payer: Medicaid Other | Admitting: Family Medicine

## 2022-03-03 ENCOUNTER — Encounter: Payer: Self-pay | Admitting: Family Medicine

## 2022-03-03 VITALS — BP 140/92 | Ht 70.0 in | Wt 237.0 lb

## 2022-03-03 DIAGNOSIS — M5416 Radiculopathy, lumbar region: Secondary | ICD-10-CM | POA: Diagnosis not present

## 2022-03-03 DIAGNOSIS — S39012A Strain of muscle, fascia and tendon of lower back, initial encounter: Secondary | ICD-10-CM | POA: Insufficient documentation

## 2022-03-03 MED ORDER — HYDROCODONE-ACETAMINOPHEN 5-325 MG PO TABS: 1.0000 | ORAL_TABLET | Freq: Three times a day (TID) | ORAL | 0 refills | Status: AC | PRN

## 2022-03-03 MED ORDER — MELOXICAM 15 MG PO TABS: 15.0000 mg | ORAL_TABLET | Freq: Every day | ORAL | 1 refills | Status: AC

## 2022-03-03 NOTE — Patient Instructions (Addendum)
Nice to meet you Please use heat  Please try the exercises  Please try the mobic  Please use the norco as needed   Please send me a message in MyChart with any questions or updates.  Please see me back in 3-4 weeks.   --Dr. Olean Ree de conocerlo Por favor [place] calor Por favor prueba los ejercicios. Por favor prueba el mobic [employer] Norco segn sea necesario. Por favor enveme un mensaje en MyChart con cualquier pregunta o actualizacin. Por favor, vuelve a verme en 3-4 semanas.

## 2022-03-03 NOTE — Assessment & Plan Note (Signed)
Acutely occurring.  Does have intermittent left-sided radicular type pain.  This pain seems primarily secondary to the lower back pain. -Counseled on home exercise therapy and supportive care. -Could consider physical therapy.

## 2022-03-03 NOTE — Assessment & Plan Note (Signed)
Acutely occurring.  Imaging has been demonstrating degenerative changes which may be playing a role. -Counseled on home exercise therapy and supportive care. -Meloxicam. -Norco. -Could consider physical therapy.

## 2022-03-03 NOTE — Progress Notes (Signed)
  Jeremiah Mcconnell - 36 y.o. male MRN [id number]  Date of birth: 07/26/1985  SUBJECTIVE:  Including CC & ROS.  No chief complaint on file.   [name] is a 36 y.o. male that is presenting with acute bilateral low back pain with intermittent left-sided radicular type pain.  Has been ongoing for about 2 months.  No history of surgery.  No history of injury..  An in person Spanish interpreter is used for this interview.  Review of the emergency department note from 10/3 shows she was counseled on supportive care Review of the office note from 10/31 shows she was provided prednisone Independent review of the lumbar spine x-ray from 10/27 shows no acute changes.  Review of Systems See HPI   HISTORY: Past Medical, Surgical, Social, and Family History Reviewed & Updated per EMR.   Pertinent Historical Findings include:  History reviewed. No pertinent past medical history.  History reviewed. No pertinent surgical history.   PHYSICAL EXAM:  VS: BP (!) 140/92 (BP Location: Left Arm, Patient Position: Sitting)   Ht 5\' 10"  (1.778 m)   Wt 237 lb (107.5 kg)   BMI 34.01 kg/m  Physical Exam Gen: NAD, alert, cooperative with exam, well-appearing MSK:  Neurovascularly intact       ASSESSMENT & PLAN:   Lumbar radiculopathy Acutely occurring.  Does have intermittent left-sided radicular type pain.  This pain seems primarily secondary to the lower back pain. -Counseled on home exercise therapy and supportive care. -Could consider physical therapy.  Strain of lumbar region Acutely occurring.  Imaging has been demonstrating degenerative changes which may be playing a role. -Counseled on home exercise therapy and supportive care. -Meloxicam. -Norco. -Could consider physical therapy.

## 2022-03-31 ENCOUNTER — Ambulatory Visit: Payer: Medicaid Other | Admitting: Family Medicine

## 2022-07-04 ENCOUNTER — Encounter: Payer: Self-pay | Admitting: *Deleted

## 2022-09-05 DIAGNOSIS — H5213 Myopia, bilateral: Secondary | ICD-10-CM | POA: Diagnosis not present

## 2023-08-23 ENCOUNTER — Ambulatory Visit: Admitting: Medical
